# Patient Record
Sex: Male | Born: 1955
Health system: Southern US, Community
[De-identification: ages and names within clinical notes are randomized; demographics above are authoritative.]

## PROBLEM LIST (undated history)

## (undated) DIAGNOSIS — R011 Cardiac murmur, unspecified: Secondary | ICD-10-CM

## (undated) DIAGNOSIS — H269 Unspecified cataract: Secondary | ICD-10-CM

## (undated) DIAGNOSIS — E119 Type 2 diabetes mellitus without complications: Secondary | ICD-10-CM

## (undated) DIAGNOSIS — N200 Calculus of kidney: Secondary | ICD-10-CM

## (undated) DIAGNOSIS — M1990 Unspecified osteoarthritis, unspecified site: Secondary | ICD-10-CM

## (undated) DIAGNOSIS — N400 Enlarged prostate without lower urinary tract symptoms: Secondary | ICD-10-CM

## (undated) DIAGNOSIS — E785 Hyperlipidemia, unspecified: Secondary | ICD-10-CM

## (undated) HISTORY — DX: Calculus of kidney: N20.0

## (undated) HISTORY — DX: Unspecified osteoarthritis, unspecified site: M19.90

## (undated) HISTORY — DX: Type 2 diabetes mellitus without complications: E11.9

## (undated) HISTORY — DX: Benign prostatic hyperplasia without lower urinary tract symptoms: N40.0

## (undated) HISTORY — DX: Hyperlipidemia, unspecified: E78.5

## (undated) HISTORY — DX: Unspecified cataract: H26.9

## (undated) HISTORY — DX: Cardiac murmur, unspecified: R01.1

---

## 1989-07-27 DIAGNOSIS — E785 Hyperlipidemia, unspecified: Secondary | ICD-10-CM

## 1989-07-27 HISTORY — DX: Hyperlipidemia, unspecified: E78.5

## 1996-02-25 ENCOUNTER — Encounter: Payer: Self-pay | Admitting: Family Medicine

## 1996-03-03 ENCOUNTER — Encounter: Payer: Self-pay | Admitting: Family Medicine

## 1996-03-03 LAB — CONVERTED CEMR LAB: PSA: 0.4 ng/mL

## 1997-08-03 ENCOUNTER — Encounter: Payer: Self-pay | Admitting: Family Medicine

## 1997-08-03 LAB — CONVERTED CEMR LAB: Blood Glucose, Fasting: 107 mg/dL

## 2000-02-25 ENCOUNTER — Encounter: Payer: Self-pay | Admitting: Family Medicine

## 2000-02-28 ENCOUNTER — Encounter: Payer: Self-pay | Admitting: Family Medicine

## 2001-01-24 HISTORY — PX: OTHER SURGICAL HISTORY: SHX169

## 2002-03-26 DIAGNOSIS — N400 Enlarged prostate without lower urinary tract symptoms: Secondary | ICD-10-CM

## 2002-03-26 HISTORY — DX: Benign prostatic hyperplasia without lower urinary tract symptoms: N40.0

## 2002-04-17 ENCOUNTER — Encounter: Payer: Self-pay | Admitting: Family Medicine

## 2002-04-17 LAB — CONVERTED CEMR LAB
Blood Glucose, Fasting: 108 mg/dL
TSH: 0.72 microintl units/mL

## 2004-03-28 ENCOUNTER — Encounter: Payer: Self-pay | Admitting: Family Medicine

## 2004-03-28 LAB — CONVERTED CEMR LAB: TSH: 0.98 microintl units/mL

## 2005-06-26 ENCOUNTER — Encounter: Payer: Self-pay | Admitting: Family Medicine

## 2005-06-26 LAB — CONVERTED CEMR LAB: PSA: 0.34 ng/mL

## 2005-06-28 ENCOUNTER — Ambulatory Visit: Payer: Self-pay | Admitting: Family Medicine

## 2005-06-28 LAB — CONVERTED CEMR LAB: Blood Glucose, Fasting: 117 mg/dL

## 2005-07-02 ENCOUNTER — Ambulatory Visit: Payer: Self-pay | Admitting: Family Medicine

## 2005-07-23 ENCOUNTER — Ambulatory Visit: Payer: Self-pay | Admitting: Family Medicine

## 2006-07-04 ENCOUNTER — Ambulatory Visit: Payer: Self-pay | Admitting: Family Medicine

## 2006-07-04 LAB — CONVERTED CEMR LAB
Blood Glucose, Fasting: 115 mg/dL
PSA: 0.37 ng/mL

## 2006-07-09 ENCOUNTER — Ambulatory Visit: Payer: Self-pay | Admitting: Family Medicine

## 2006-08-12 ENCOUNTER — Ambulatory Visit: Payer: Self-pay | Admitting: Family Medicine

## 2007-07-09 ENCOUNTER — Encounter: Payer: Self-pay | Admitting: Family Medicine

## 2007-07-09 DIAGNOSIS — J309 Allergic rhinitis, unspecified: Secondary | ICD-10-CM | POA: Insufficient documentation

## 2007-07-09 DIAGNOSIS — E785 Hyperlipidemia, unspecified: Secondary | ICD-10-CM | POA: Insufficient documentation

## 2007-07-09 DIAGNOSIS — N400 Enlarged prostate without lower urinary tract symptoms: Secondary | ICD-10-CM | POA: Insufficient documentation

## 2007-07-14 ENCOUNTER — Ambulatory Visit: Payer: Self-pay | Admitting: Family Medicine

## 2007-07-14 DIAGNOSIS — N200 Calculus of kidney: Secondary | ICD-10-CM | POA: Insufficient documentation

## 2007-07-14 DIAGNOSIS — E119 Type 2 diabetes mellitus without complications: Secondary | ICD-10-CM | POA: Insufficient documentation

## 2007-07-14 LAB — CONVERTED CEMR LAB
ALT: 24 units/L (ref 0–53)
AST: 27 units/L (ref 0–37)
Albumin: 3.6 g/dL (ref 3.5–5.2)
BUN: 8 mg/dL (ref 6–23)
Bilirubin, Direct: 0.1 mg/dL (ref 0.0–0.3)
Cholesterol: 184 mg/dL (ref 0–200)
Potassium: 3.6 meq/L (ref 3.5–5.1)
Sodium: 143 meq/L (ref 135–145)
TSH: 0.74 microintl units/mL (ref 0.35–5.50)
Total Bilirubin: 0.7 mg/dL (ref 0.3–1.2)
Total CHOL/HDL Ratio: 6.3
VLDL: 43 mg/dL — ABNORMAL HIGH (ref 0–40)

## 2007-07-16 ENCOUNTER — Ambulatory Visit: Payer: Self-pay | Admitting: Family Medicine

## 2007-07-28 HISTORY — PX: COLONOSCOPY: SHX174

## 2007-08-08 ENCOUNTER — Ambulatory Visit: Payer: Self-pay | Admitting: Gastroenterology

## 2007-08-22 ENCOUNTER — Encounter: Payer: Self-pay | Admitting: Family Medicine

## 2007-08-22 ENCOUNTER — Encounter: Payer: Self-pay | Admitting: Gastroenterology

## 2007-08-22 ENCOUNTER — Ambulatory Visit: Payer: Self-pay | Admitting: Gastroenterology

## 2007-08-22 DIAGNOSIS — D126 Benign neoplasm of colon, unspecified: Secondary | ICD-10-CM | POA: Insufficient documentation

## 2007-09-03 ENCOUNTER — Emergency Department (HOSPITAL_COMMUNITY): Admission: EM | Admit: 2007-09-03 | Discharge: 2007-09-03 | Payer: Self-pay | Admitting: Emergency Medicine

## 2007-09-24 ENCOUNTER — Telehealth (INDEPENDENT_AMBULATORY_CARE_PROVIDER_SITE_OTHER): Payer: Self-pay | Admitting: *Deleted

## 2008-02-12 ENCOUNTER — Encounter: Payer: Self-pay | Admitting: Family Medicine

## 2008-03-09 ENCOUNTER — Encounter: Payer: Self-pay | Admitting: Family Medicine

## 2008-09-20 ENCOUNTER — Ambulatory Visit: Payer: Self-pay | Admitting: Family Medicine

## 2008-09-21 LAB — CONVERTED CEMR LAB
ALT: 23 units/L (ref 0–53)
AST: 23 units/L (ref 0–37)
BUN: 12 mg/dL (ref 6–23)
Basophils Relative: 0.5 % (ref 0.0–3.0)
CO2: 28 meq/L (ref 19–32)
Calcium: 8.5 mg/dL (ref 8.4–10.5)
Creatinine,U: 308.3 mg/dL
Eosinophils Relative: 1.6 % (ref 0.0–5.0)
Glucose, Bld: 120 mg/dL — ABNORMAL HIGH (ref 70–99)
HCT: 41 % (ref 39.0–52.0)
Hemoglobin: 13.8 g/dL (ref 13.0–17.0)
LDL Cholesterol: 144 mg/dL — ABNORMAL HIGH (ref 0–99)
Lymphocytes Relative: 35.6 % (ref 12.0–46.0)
MCHC: 33.7 g/dL (ref 30.0–36.0)
MCV: 86 fL (ref 78.0–100.0)
Neutrophils Relative %: 55.5 % (ref 43.0–77.0)
Platelets: 198 10*3/uL (ref 150–400)
RDW: 13.1 % (ref 11.5–14.6)
Total CHOL/HDL Ratio: 6
Total Protein: 7.1 g/dL (ref 6.0–8.3)
Triglycerides: 98 mg/dL (ref 0–149)
VLDL: 20 mg/dL (ref 0–40)
WBC: 7.6 10*3/uL (ref 4.5–10.5)

## 2008-09-23 ENCOUNTER — Ambulatory Visit: Payer: Self-pay | Admitting: Family Medicine

## 2008-09-23 DIAGNOSIS — M25519 Pain in unspecified shoulder: Secondary | ICD-10-CM | POA: Insufficient documentation

## 2009-09-21 ENCOUNTER — Ambulatory Visit: Payer: Self-pay | Admitting: Family Medicine

## 2009-09-21 LAB — CONVERTED CEMR LAB
AST: 23 units/L (ref 0–37)
BUN: 14 mg/dL (ref 6–23)
Bilirubin, Direct: 0 mg/dL (ref 0.0–0.3)
CO2: 29 meq/L (ref 19–32)
Calcium: 8.9 mg/dL (ref 8.4–10.5)
Chloride: 107 meq/L (ref 96–112)
Creatinine, Ser: 1 mg/dL (ref 0.4–1.5)
Creatinine,U: 205 mg/dL
GFR calc non Af Amer: 99.56 mL/min (ref >60)
HDL: 38.5 mg/dL — ABNORMAL LOW (ref >39.00)
Microalb Creat Ratio: 1.5 mg/g (ref 0.0–30.0)
Microalb, Ur: 0.3 mg/dL (ref 0.0–1.9)
Potassium: 4.1 meq/L (ref 3.5–5.1)
Sodium: 143 meq/L (ref 135–145)
TSH: 0.71 microintl units/mL (ref 0.35–5.50)
Total Bilirubin: 0.6 mg/dL (ref 0.3–1.2)
Total Protein: 7.1 g/dL (ref 6.0–8.3)
Triglycerides: 147 mg/dL (ref 0.0–149.0)
VLDL: 29.4 mg/dL (ref 0.0–40.0)

## 2009-09-26 ENCOUNTER — Ambulatory Visit: Payer: Self-pay | Admitting: Family Medicine

## 2010-06-29 ENCOUNTER — Encounter (INDEPENDENT_AMBULATORY_CARE_PROVIDER_SITE_OTHER): Payer: Self-pay | Admitting: *Deleted

## 2010-10-03 ENCOUNTER — Ambulatory Visit: Payer: Self-pay | Admitting: Family Medicine

## 2010-10-03 DIAGNOSIS — E559 Vitamin D deficiency, unspecified: Secondary | ICD-10-CM | POA: Insufficient documentation

## 2010-10-03 LAB — CONVERTED CEMR LAB
AST: 24 units/L (ref 0–37)
Albumin: 3.8 g/dL (ref 3.5–5.2)
Basophils Relative: 0.4 % (ref 0.0–3.0)
CO2: 29 meq/L (ref 19–32)
Calcium: 9.2 mg/dL (ref 8.4–10.5)
Chloride: 107 meq/L (ref 96–112)
Cholesterol: 202 mg/dL — ABNORMAL HIGH (ref 0–200)
Creatinine,U: 273.5 mg/dL
Eosinophils Absolute: 0.1 10*3/uL (ref 0.0–0.7)
Eosinophils Relative: 1.5 % (ref 0.0–5.0)
Glucose, Bld: 109 mg/dL — ABNORMAL HIGH (ref 70–99)
HCT: 41.7 % (ref 39.0–52.0)
Lymphocytes Relative: 32.5 % (ref 12.0–46.0)
Lymphs Abs: 2.7 10*3/uL (ref 0.7–4.0)
MCV: 86.8 fL (ref 78.0–100.0)
Monocytes Relative: 5.5 % (ref 3.0–12.0)
Neutro Abs: 5.1 10*3/uL (ref 1.4–7.7)
Neutrophils Relative %: 60.1 % (ref 43.0–77.0)
PSA: 0.33 ng/mL (ref 0.10–4.00)
Platelets: 207 10*3/uL (ref 150.0–400.0)
RBC: 4.81 M/uL (ref 4.22–5.81)
RDW: 13.8 % (ref 11.5–14.6)
TSH: 0.83 microintl units/mL (ref 0.35–5.50)

## 2010-10-04 LAB — CONVERTED CEMR LAB: Vit D, 25-Hydroxy: 32 ng/mL (ref 30–89)

## 2010-10-05 ENCOUNTER — Ambulatory Visit: Payer: Self-pay | Admitting: Family Medicine

## 2010-12-28 NOTE — Letter (Signed)
Summary: Nadara Eaton letter  [place] at [facility]  [address] [place], [place] [address]   Phone: [phone]  Fax: [phone]       06/29/2010 MRN: [id number]  [facility] [address] [place], [place]  [address]  Dear Mr. MOHANNAD, [facility] [place], and [facility] announce the retirement of Arta Silence, M.D., from full-time practice at the [facility] office effective May 25, 2010 and his plans of returning part-time.  It is important to Dr. Hetty Ely and to our practice that you understand that [facility] has seven physicians in our office for your health care needs.  We will continue to offer the same exceptional care that you have today.    Dr. Hetty Ely has spoken to many of you about his plans for retirement and returning part-time in the fall.   We will continue to work with you through the transition to schedule appointments for you in the office and meet the high standards that [facility] is committed to.   Again, it is with great pleasure that we share the news that Dr. Hetty Ely will return to [facility] in October of 2011 with a reduced schedule.    If you have any questions, or would like to request an appointment with one of our physicians, please call us at [phone] and press the option for Scheduling an appointment.  We take pleasure in providing you with excellent patient care and look forward to seeing you at your next office visit.  Our [facility] Physicians are:  Tillman Abide, M.D. Laurita Quint, M.D. Roxy Manns, M.D. Kerby Nora, M.D. Hannah Beat, M.D. Ruthe Mannan, M.D. We proudly welcomed Raechel Ache, M.D. and Eustaquio Boyden, M.D. to the practice in July/August 2011.  Sincerely,  [facility]

## 2010-12-28 NOTE — Assessment & Plan Note (Signed)
Summary: CPX/CLE   Vital Signs:  Patient profile:   55 year old male Weight:      233.25 pounds BMI:     31.75 Temp:     98.5 degrees F oral Pulse rate:   64 / minute Pulse rhythm:   regular BP sitting:   132 / 82  (left arm) Cuff size:   large  Vitals Entered By: Sydell Axon LPN (October 05, 2010 9:22 AM) CC: 30 Minute checkup, had a colonoscopy 09/08   History of Present Illness: Pt here for Comp Exam, feels well with no complaints. 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 Apply As Much As Twice A Day. 2)  Multivitamins  Tabs (Multiple Vitamin) .... Take One By Mouth Daily  Allergies: No Known Drug Allergies  Past History:  Past Medical History: Last updated: 07/09/2007 Allergic rhinitis:03/2002 Hyperlipidemia: 1990's Benign prostatic hypertrophy: 03/2002  Past Surgical History: Last updated: 09/02/2007 DERMATOLOGY : DR. HALL DIAGNOSIS AKN OF SCALP 01/2001 COLONOSCOPY BENIGN POLYPS 08/22/07     2018  Family History: Last updated: 10/05/2010 Father: dec 92 Stroke  ANEMIA, HTN, DM Mother: dec 84 MI  HTN, DM, ELEVATED CHOLESTERAL  BROTHER A 60  DM,  SKIN CANCER Brother A 49 SISTER A 53 Vitiligo  DM  CV: NEGATIVE HBP: + MOTHER DM: +MOTHER GOUT/ARTHRITIS:  PROSTATE CANCER: NEGATIVE: BROTHER SKIN CANCER BREAST/OVARIAN/UTERINE CANCER: COLON CANCER: NEGATIVE DEPRESSION: NEGATIVE ETOH/DRUG ABUSE: + BROTHER, ETOH OTHER: NEGATIVE STROKE  Social History: Last updated: 07/16/2007 Marital Status: Married LIVES WITH WIFE Children: 2 CHILDREN  out of home Occupation:[employer]   Risk Factors: Alcohol Use: 0 (10/05/2010) Caffeine Use: 1 per week (10/05/2010) Exercise: no (10/05/2010)  Risk Factors: Smoking Status: never (10/05/2010) Passive Smoke Exposure: no (10/05/2010)  Family History: Father: dec 92 Stroke  ANEMIA, HTN, DM Mother: dec 84 MI  HTN, DM, ELEVATED CHOLESTERAL  BROTHER A 60  DM,  SKIN CANCER Brother A 49 SISTER A 53 Vitiligo DM  CV: NEGATIVE HBP: + MOTHER DM: +MOTHER GOUT/ARTHRITIS:  PROSTATE CANCER: NEGATIVE: BROTHER SKIN CANCER BREAST/OVARIAN/UTERINE CANCER: COLON CANCER: NEGATIVE DEPRESSION: NEGATIVE ETOH/DRUG ABUSE: + BROTHER, ETOH OTHER: NEGATIVE STROKE  Social History: Caffeine use/day:  1 per week  Review of Systems General:  Denies chills, fatigue, fever, sweats, weakness, and weight loss. Eyes:  Denies blurring, discharge, eye pain, and itching. ENT:  Denies decreased hearing, ear discharge, earache, and ringing in ears. CV:  Denies chest pain or discomfort, fainting, fatigue, palpitations, shortness of breath with exertion, swelling of feet, and swelling of hands. Resp:  Denies cough, shortness of breath, and wheezing. GI:  Denies abdominal pain, bloody stools, change in bowel habits, constipation, dark tarry stools, diarrhea, indigestion,  loss of appetite, nausea, vomiting, vomiting blood, and yellowish skin color. GU:  Complains of nocturia; denies discharge, dysuria, and urinary frequency; two times a night regiularly. MS:  Complains of cramps; denies joint pain, low back pain, muscle aches, and stiffness; occas. Derm:  Denies  dryness, itching, and rash. Neuro:  Denies numbness, poor balance, tingling, and tremors.  Physical Exam  General:  Well-developed,well-nourished,in no acute distress; alert,appropriate and cooperative throughout examination Head:  Normocephalic and atraumatic without obvious abnormalities. No apparent alopecia or balding. Sinuses nontender. Eyes:  Conjunctiva clear bilaterally.  Ears:  External ear exam shows no significant lesions or deformities.  Otoscopic examination reveals clear canals, tympanic membranes are intact bilaterally without bulging, retraction, inflammation or discharge. Hearing is grossly normal bilaterally. Nose:  External nasal examination shows no deformity or inflammation. Nasal mucosa are pink and moist without lesions or exudates. Mouth:  Oral mucosa and oropharynx without lesions or exudates.  Teeth in good repair. Neck:  No deformities, masses, or tenderness noted. Chest Wall:  No deformities, masses, tenderness or gynecomastia noted. Breasts:  No masses or gynecomastia noted Lungs:  Normal respiratory effort, chest expands symmetrically. Lungs are clear to auscultation, no crackles or wheezes. Heart:  Normal rate and regular rhythm. S1 and S2 normal without gallop, murmur, click, rub or other extra sounds. Occas rare dropped beat. Abdomen:  Bowel sounds positive,abdomen soft and non-tender without masses, organomegaly or hernias noted. Rectal:  No external abnormalities noted. Normal sphincter tone. No rectal masses or tenderness. G neg. Genitalia:  Testes bilaterally descended without nodularity, tenderness or masses. No scrotal masses or lesions. No penis lesions or urethral discharge. Prostate:  Prostate gland firm and smooth, no enlargement, nodularity, tenderness, mass, asymmetry or induration. 20-30gms. Msk:  No deformity or scoliosis noted of thoracic or lumbar spine.   Pulses:  R and L carotid,radial,femoral,dorsalis pedis and posterior tibial pulses are full  and equal bilaterally Extremities:  No clubbing, cyanosis, edema, or deformity noted with normal full range of motion of all joints.   Neurologic:  No cranial nerve deficits noted. Station and gait are normal. Sensory, motor and coordinative functions appear intact. Left arm radiculopathy resolved. Skin:  Intact without suspicious lesions or rashes. Chronic mild nudular rash of scalp, keeps hair close cut. Cervical Nodes:  No lymphadenopathy noted Inguinal Nodes:  No significant adenopathy Psych:  Cognition and judgment appear intact. Alert and cooperative with normal attention span and concentration. No apparent delusions, illusions, hallucinations   Impression & Recommendations:  Problem # 1:  HEALTH MAINTENANCE EXAM (ICD-V70.0)  Reviewed preventive care protocols, scheduled due services, and updated immunizations. Flu vacc today.  Problem # 2:  DISEASE, HAIR NEC CROWN OF HEAD (ICD-704.8) Assessment: Unchanged Stable but using generic which he feels does not give quite as good of control.  Problem # 3:  VITAMIN D DEFICIENCY (ICD-268.9) Assessment: Improved Therapeutic on 1000IU  daily. Cont.  Problem # 4:  COLONIC POLYPS, BENIGN (ICD-211.3) Assessment: Unchanged Due Colonoscopy in 18.  Problem # 5:  RENAL CALCULUS (ICD-592.0) Assessment: Improved None recently.  Problem # 6:  BENIGN PROSTATIC HYPERTROPHY (ICD-600.00) Assessment: Unchanged Exam and PSA stable. Sxs stable.  Problem # 7:  HYPERLIPIDEMIA (ICD-272.4) Assessment: Unchanged Adequate. Watch sweets and carbs and start regular exercise. Labs Reviewed: SGOT: 24 (10/03/2010)   SGPT: 21 (10/03/2010)   HDL:35.50 (10/03/2010), 38.50 (09/21/2009)  LDL:125 (09/21/2009), 144 (09/20/2008)  Chol:202 (10/03/2010), 193 (09/21/2009)  Trig:370.0 (10/03/2010), 147.0 (09/21/2009)  Problem # 8:  ALLERGIC RHINITIS (ICD-477.9) Assessment: Unchanged Stable  and not a problem.  Complete Medication List: 1)  Clobetasol Propionate  0.05 % Crea (Clobetasol propionate) .... Apply as much as twice a day. 2)  Multivitamins Tabs (Multiple vitamin) .... Take one by mouth daily 3)  Vitamin D 1000 Unit Tabs (Cholecalciferol) .... Take one by mouth daily  Other Orders: Admin 1st Vaccine (60454) Flu Vaccine 68yrs + (09811) Prescriptions: CLOBETASOL PROPIONATE 0.05 % CREA (CLOBETASOL PROPIONATE) apply as much as twice a day.  #60gms x PRN   Entered and Authorized by:   Shaune Leeks MD   Signed by:   Shaune Leeks MD on 10/05/2010   Method used:   Elect[facility]ically to        [facility] [id number] (retail[facility]       [address]       [place], [place]  [address]       Ph: [phone] or [phone]       Fax: [phone]   RxID:   [id number]    Orders Added: 1)  Admin 1st Vaccine [90471] 2)  Flu Vaccine 15yrs + [38756] 3)  Est. Patient 40-64 years [43329]    Current Allergies (reviewed today): No known allergies   Flu Vaccine Consent Questions     Do you have a history of severe allergic reactions to this vaccine? no    Any prior history of allergic reactions to egg and/or gelatin? no    Do you have a sensitivity to the preservative Thimersol? no    Do you have a past history of Guillan-Barre Syndrome? no    Do you currently have an acute febrile illness? no    Have you ever had a severe reaction to latex? no    Vaccine information given and explained to patient? yes    Are you currently pregnant? no    Lot Number:[id number]   Exp Date:05/26/2011   Site Given  Left Deltoid [email or web address]

## 2011-09-06 LAB — URINALYSIS, ROUTINE W REFLEX MICROSCOPIC
Leukocytes, UA: NEGATIVE
Nitrite: NEGATIVE
Specific Gravity, Urine: 1.021
Urobilinogen, UA: 0.2
pH: 5.5

## 2011-10-04 ENCOUNTER — Other Ambulatory Visit: Payer: Self-pay | Admitting: Family Medicine

## 2011-10-04 DIAGNOSIS — N400 Enlarged prostate without lower urinary tract symptoms: Secondary | ICD-10-CM

## 2011-10-04 DIAGNOSIS — E785 Hyperlipidemia, unspecified: Secondary | ICD-10-CM

## 2011-10-04 DIAGNOSIS — D126 Benign neoplasm of colon, unspecified: Secondary | ICD-10-CM

## 2011-10-04 DIAGNOSIS — E749 Disorder of carbohydrate metabolism, unspecified: Secondary | ICD-10-CM

## 2011-10-04 DIAGNOSIS — E559 Vitamin D deficiency, unspecified: Secondary | ICD-10-CM

## 2011-10-08 ENCOUNTER — Other Ambulatory Visit (INDEPENDENT_AMBULATORY_CARE_PROVIDER_SITE_OTHER): Payer: 59

## 2011-10-08 DIAGNOSIS — N400 Enlarged prostate without lower urinary tract symptoms: Secondary | ICD-10-CM

## 2011-10-08 DIAGNOSIS — E749 Disorder of carbohydrate metabolism, unspecified: Secondary | ICD-10-CM

## 2011-10-08 DIAGNOSIS — D126 Benign neoplasm of colon, unspecified: Secondary | ICD-10-CM

## 2011-10-08 DIAGNOSIS — E785 Hyperlipidemia, unspecified: Secondary | ICD-10-CM

## 2011-10-08 DIAGNOSIS — E559 Vitamin D deficiency, unspecified: Secondary | ICD-10-CM

## 2011-10-09 LAB — HEPATIC FUNCTION PANEL
ALT: 24 U/L (ref 0–53)
AST: 24 U/L (ref 0–37)
Bilirubin, Direct: 0 mg/dL (ref 0.0–0.3)
Total Bilirubin: 0.7 mg/dL (ref 0.3–1.2)
Total Protein: 7 g/dL (ref 6.0–8.3)

## 2011-10-09 LAB — CBC WITH DIFFERENTIAL/PLATELET
Basophils Relative: 0.4 % (ref 0.0–3.0)
Eosinophils Relative: 1.3 % (ref 0.0–5.0)
HCT: 42.5 % (ref 39.0–52.0)
Hemoglobin: 14 g/dL (ref 13.0–17.0)
Lymphocytes Relative: 35.3 % (ref 12.0–46.0)
MCHC: 32.8 g/dL (ref 30.0–36.0)
Monocytes Absolute: 0.2 10*3/uL (ref 0.1–1.0)
Monocytes Relative: 2.8 % — ABNORMAL LOW (ref 3.0–12.0)
Neutrophils Relative %: 60.2 % (ref 43.0–77.0)
Platelets: 208 10*3/uL (ref 150.0–400.0)
RBC: 4.97 Mil/uL (ref 4.22–5.81)

## 2011-10-09 LAB — RENAL FUNCTION PANEL
Albumin: 3.7 g/dL (ref 3.5–5.2)
CO2: 29 mEq/L (ref 19–32)
Creatinine, Ser: 1 mg/dL (ref 0.4–1.5)
GFR: 97.7 mL/min (ref >60.00)
Phosphorus: 3.5 mg/dL (ref 2.3–4.6)
Potassium: 4.5 mEq/L (ref 3.5–5.1)

## 2011-10-09 LAB — MICROALBUMIN / CREATININE URINE RATIO: Creatinine,U: 259 mg/dL

## 2011-10-09 LAB — TSH: TSH: 1.15 u[IU]/mL (ref 0.35–5.50)

## 2011-10-09 LAB — LIPID PANEL
Triglycerides: 169 mg/dL — ABNORMAL HIGH (ref 0.0–149.0)
VLDL: 33.8 mg/dL (ref 0.0–40.0)

## 2011-10-09 LAB — VITAMIN D 25 HYDROXY (VIT D DEFICIENCY, FRACTURES): Vit D, 25-Hydroxy: 26 ng/mL — ABNORMAL LOW (ref 30–89)

## 2011-10-10 ENCOUNTER — Encounter: Payer: Self-pay | Admitting: Family Medicine

## 2011-10-11 ENCOUNTER — Ambulatory Visit (INDEPENDENT_AMBULATORY_CARE_PROVIDER_SITE_OTHER): Payer: 59 | Admitting: Family Medicine

## 2011-10-11 ENCOUNTER — Encounter: Payer: Self-pay | Admitting: Family Medicine

## 2011-10-11 VITALS — BP 118/70 | HR 84 | Temp 98.7°F | Ht 72.0 in | Wt 226.2 lb

## 2011-10-11 DIAGNOSIS — Z23 Encounter for immunization: Secondary | ICD-10-CM

## 2011-10-11 DIAGNOSIS — E749 Disorder of carbohydrate metabolism, unspecified: Secondary | ICD-10-CM

## 2011-10-11 DIAGNOSIS — E559 Vitamin D deficiency, unspecified: Secondary | ICD-10-CM

## 2011-10-11 DIAGNOSIS — D126 Benign neoplasm of colon, unspecified: Secondary | ICD-10-CM

## 2011-10-11 DIAGNOSIS — E785 Hyperlipidemia, unspecified: Secondary | ICD-10-CM

## 2011-10-11 DIAGNOSIS — N400 Enlarged prostate without lower urinary tract symptoms: Secondary | ICD-10-CM

## 2011-10-11 MED ORDER — CLOBETASOL PROPIONATE 0.05 % EX CREA: TOPICAL_CREAM | Freq: Two times a day (BID) | CUTANEOUS | Status: DC

## 2011-10-11 NOTE — Progress Notes (Signed)
  Subjective:    Patient ID: Philip Callahan, male    DOB: Sep 21, 1956, 55 y.o.   MRN: [id number]  HPI Pt here for yearly Comp Exam. He has not been seen since PE last year. He feels well and has no complaints.     Review of Systems  Constitutional: Negative for fever, chills, diaphoresis, appetite change, fatigue and unexpected weight change.  HENT: Negative for hearing loss, ear pain, tinnitus and ear discharge.   Eyes: Positive for visual disturbance (more difficult at night. ). Negative for pain and discharge.  Respiratory: Negative for cough, shortness of breath and wheezing.   Cardiovascular: Negative for chest pain and palpitations.       No SOB w/ exertion  Gastrointestinal: Negative for nausea, vomiting, abdominal pain, diarrhea, constipation and blood in stool.       No heartburn or swallowing problems.  Genitourinary: Negative for dysuria, frequency and difficulty urinating.       Three time nocturia  Musculoskeletal: Negative for myalgias, back pain and arthralgias.  Skin: Negative for rash.       No itching or dryness.  Neurological: Negative for tremors and numbness.       No tingling or balance problems.  Hematological: Negative for adenopathy. Does not bruise/bleed easily.  Psychiatric/Behavioral: Negative for dysphoric mood and agitation.       Objective:   Physical Exam  Constitutional: He is oriented to person, place, and time. He appears well-developed and well-nourished. No distress.  HENT:  Head: Normocephalic and atraumatic.  Right Ear: External ear normal.  Left Ear: External ear normal.  Nose: Nose normal.  Mouth/Throat: Oropharynx is clear and moist.  Eyes: Conjunctivae and EOM are normal. Pupils are equal, round, and reactive to light. Right eye exhibits no discharge. Left eye exhibits no discharge. No scleral icterus.  Neck: Normal range of motion. Neck supple. No thyromegaly present.  Cardiovascular: Normal rate, regular rhythm, normal heart sounds  and intact distal pulses.   No murmur heard. Pulmonary/Chest: Effort normal and breath sounds normal. No respiratory distress. He has no wheezes.  Abdominal: Soft. Bowel sounds are normal. He exhibits no distension and no mass. There is no tenderness. There is no rebound and no guarding.  Genitourinary: Penis normal.       No rectal done.  Musculoskeletal: Normal range of motion. He exhibits no edema.  Lymphadenopathy:    He has no cervical adenopathy.  Neurological: He is alert and oriented to person, place, and time. Coordination normal.  Skin: Skin is warm and dry. No rash noted. He is not diaphoretic.  Psychiatric: He has a normal mood and affect. His behavior is normal. Judgment and thought content normal.          Assessment & Plan:  HMPE

## 2011-10-11 NOTE — Assessment & Plan Note (Signed)
Adequate but aovid carbs to decrease Trigs and avoid fatty foods to decrease LDL. Lab Results  Component Value Date   CHOL 178 10/08/2011   HDL 41.00 10/08/2011   LDLCALC 103* 10/08/2011   LDLDIRECT 104.1 10/03/2010   TRIG 169.0* 10/08/2011   CHOLHDL 4 10/08/2011

## 2011-10-11 NOTE — Assessment & Plan Note (Signed)
Continues slightly elevated. Watch sweet and carb intake, esp over the holiday season. Exercise helps.

## 2011-10-11 NOTE — Patient Instructions (Signed)
RTC in one year for PE. Call a few months ahead to establish with new doctor.

## 2011-10-11 NOTE — Assessment & Plan Note (Signed)
Rescope in the future.

## 2011-10-11 NOTE — Assessment & Plan Note (Signed)
Stable

## 2011-10-11 NOTE — Assessment & Plan Note (Signed)
Still deficient. Needs to take Vit D 1000Iu daily, every day.

## 2012-09-30 ENCOUNTER — Other Ambulatory Visit: Payer: Self-pay | Admitting: Family Medicine

## 2012-09-30 DIAGNOSIS — Z125 Encounter for screening for malignant neoplasm of prostate: Secondary | ICD-10-CM

## 2012-09-30 DIAGNOSIS — E7800 Pure hypercholesterolemia, unspecified: Secondary | ICD-10-CM

## 2012-10-09 ENCOUNTER — Other Ambulatory Visit (INDEPENDENT_AMBULATORY_CARE_PROVIDER_SITE_OTHER): Payer: 59

## 2012-10-09 DIAGNOSIS — E7800 Pure hypercholesterolemia, unspecified: Secondary | ICD-10-CM

## 2012-10-09 DIAGNOSIS — Z125 Encounter for screening for malignant neoplasm of prostate: Secondary | ICD-10-CM

## 2012-10-09 LAB — LDL CHOLESTEROL, DIRECT: Direct LDL: 141.5 mg/dL

## 2012-10-09 LAB — LIPID PANEL
Cholesterol: 224 mg/dL — ABNORMAL HIGH (ref 0–200)
HDL: 41.7 mg/dL (ref >39.00)
Total CHOL/HDL Ratio: 5
Triglycerides: 202 mg/dL — ABNORMAL HIGH (ref 0.0–149.0)
VLDL: 40.4 mg/dL — ABNORMAL HIGH (ref 0.0–40.0)

## 2012-10-09 LAB — COMPREHENSIVE METABOLIC PANEL
ALT: 25 U/L (ref 0–53)
AST: 25 U/L (ref 0–37)
BUN: 15 mg/dL (ref 6–23)
Creatinine, Ser: 1.1 mg/dL (ref 0.4–1.5)
Total Bilirubin: 0.7 mg/dL (ref 0.3–1.2)

## 2012-10-14 ENCOUNTER — Ambulatory Visit (INDEPENDENT_AMBULATORY_CARE_PROVIDER_SITE_OTHER): Payer: 59 | Admitting: Family Medicine

## 2012-10-14 ENCOUNTER — Encounter: Payer: Self-pay | Admitting: Family Medicine

## 2012-10-14 VITALS — BP 132/80 | HR 80 | Temp 98.2°F | Ht 73.0 in | Wt 234.8 lb

## 2012-10-14 DIAGNOSIS — E559 Vitamin D deficiency, unspecified: Secondary | ICD-10-CM

## 2012-10-14 DIAGNOSIS — E119 Type 2 diabetes mellitus without complications: Secondary | ICD-10-CM

## 2012-10-14 DIAGNOSIS — R739 Hyperglycemia, unspecified: Secondary | ICD-10-CM

## 2012-10-14 DIAGNOSIS — Z23 Encounter for immunization: Secondary | ICD-10-CM

## 2012-10-14 DIAGNOSIS — R7309 Other abnormal glucose: Secondary | ICD-10-CM

## 2012-10-14 DIAGNOSIS — E785 Hyperlipidemia, unspecified: Secondary | ICD-10-CM

## 2012-10-14 DIAGNOSIS — Z0000 Encounter for general adult medical examination without abnormal findings: Secondary | ICD-10-CM

## 2012-10-14 LAB — HEMOGLOBIN A1C: Hgb A1c MFr Bld: 6.6 % — ABNORMAL HIGH (ref 4.6–6.5)

## 2012-10-14 NOTE — Progress Notes (Signed)
CPE- See plan.  Routine anticipatory guidance given to patient.  See health maintenance. Tetanus 2005 Flu shot today.   PSA wnl Colonoscopy 2008, 10 year f/u.   Exercise.  "Very little".  Discussed.  Living will d/w pt.  Wife would be designated if incapacitated.    Hyperglycemia.  Recheck A1c today.  D/w pt about likely DM2 and FH, diet, weight, and exercise.    HLD.  D/w pt about this as it relates to DM2/hyperglycemia.  See plan.   PMH and SH reviewed  Meds, vitals, and allergies reviewed.   ROS: See HPI.  Otherwise negative.    GEN: nad, alert and oriented HEENT: mucous membranes moist NECK: supple w/o LA CV: rrr. PULM: ctab, no inc wob ABD: soft, +bs EXT: no edema SKIN: no acute rash

## 2012-10-14 NOTE — Patient Instructions (Addendum)
Go to the lab on the way out.  We'll contact you with your lab report.  Check out [email or web address].  Look up the food and fitness section.   Cut back on sugary foods- see the handout.  Take care.  Glad to see you.  We'll likely need to recheck your labs in 3-6 months.

## 2012-10-15 ENCOUNTER — Encounter: Payer: Self-pay | Admitting: Family Medicine

## 2012-10-15 DIAGNOSIS — Z0000 Encounter for general adult medical examination without abnormal findings: Secondary | ICD-10-CM | POA: Insufficient documentation

## 2012-10-15 NOTE — Assessment & Plan Note (Signed)
Would continue with OTC replacement.

## 2012-10-15 NOTE — Assessment & Plan Note (Signed)
Will work on diet and exercise, recheck in 6 months.

## 2012-10-15 NOTE — Assessment & Plan Note (Signed)
Likely dx with A1c 6.6  Will have pt work on diet and exercise, handout given and he'll check ADA website.  Recheck in 6 months.  See notes on labs.

## 2012-10-15 NOTE — Assessment & Plan Note (Signed)
Routine anticipatory guidance given to patient.  See health maintenance. Tetanus 2005 Flu shot today.   PSA wnl Colonoscopy 2008, 10 year f/u.   Exercise.  "Very little".  Discussed.  Living will d/w pt.  Wife would be designated if incapacitated.

## 2012-12-19 ENCOUNTER — Encounter: Payer: Self-pay | Admitting: Family Medicine

## 2012-12-19 ENCOUNTER — Ambulatory Visit (INDEPENDENT_AMBULATORY_CARE_PROVIDER_SITE_OTHER): Payer: 59 | Admitting: Family Medicine

## 2012-12-19 VITALS — BP 120/80 | HR 78 | Temp 98.8°F | Wt 220.0 lb

## 2012-12-19 DIAGNOSIS — R0981 Nasal congestion: Secondary | ICD-10-CM

## 2012-12-19 DIAGNOSIS — J3489 Other specified disorders of nose and nasal sinuses: Secondary | ICD-10-CM

## 2012-12-19 MED ORDER — FLUTICASONE PROPIONATE 50 MCG/ACT NA SUSP: 2.0000 | Freq: Every day | NASAL | Status: DC

## 2012-12-19 NOTE — Progress Notes (Signed)
Sick for 1 week.  Started with ST, felt dizzy.  Cough.  Congested.  Stuffy.  Would feel cold/hot at night.  Voice is altered, episodically hoarse.  No myalgias.  Now with some scant sputum.  Taking dayquil and nyquil.  It helps him sleep some.  Not much pain across the sinuses.    He's losing weight intentionally and working on his diet.   Had a flu shot.   Meds, vitals, and allergies reviewed.   ROS: See HPI.  Otherwise, noncontributory.  GEN: nad, alert and oriented HEENT: mucous membranes moist, tm w/o erythema, nasal exam w/o erythema, clear discharge noted,  OP with cobblestoning, sinuses not ttp x4 NECK: supple w/o LA CV: rrr.   PULM: ctab, no inc wob EXT: no edema

## 2012-12-19 NOTE — Patient Instructions (Addendum)
Take care.  Keep working on your [employer] and diet.   Use nasal saline a few times a day for the congestions.  If not better, then use the flonase.   Glad to see you.

## 2012-12-19 NOTE — Assessment & Plan Note (Signed)
From URI.  Likely viral.  Nontoxic.  Would use nasal saline and then flonase if not improved.  He agrees.  ddx d/w pt. F/u prn.

## 2013-02-11 ENCOUNTER — Other Ambulatory Visit (INDEPENDENT_AMBULATORY_CARE_PROVIDER_SITE_OTHER): Payer: 59

## 2013-02-11 DIAGNOSIS — E119 Type 2 diabetes mellitus without complications: Secondary | ICD-10-CM

## 2013-02-11 LAB — LIPID PANEL
Cholesterol: 189 mg/dL (ref 0–200)
LDL Cholesterol: 129 mg/dL — ABNORMAL HIGH (ref 0–99)
Total CHOL/HDL Ratio: 5
VLDL: 23.2 mg/dL (ref 0.0–40.0)

## 2013-02-11 LAB — HEMOGLOBIN A1C: Hgb A1c MFr Bld: 6.2 % (ref 4.6–6.5)

## 2013-02-16 ENCOUNTER — Encounter: Payer: Self-pay | Admitting: Family Medicine

## 2013-02-16 ENCOUNTER — Ambulatory Visit (INDEPENDENT_AMBULATORY_CARE_PROVIDER_SITE_OTHER): Payer: 59 | Admitting: Family Medicine

## 2013-02-16 VITALS — BP 144/84 | HR 53 | Temp 98.0°F | Wt 218.0 lb

## 2013-02-16 DIAGNOSIS — R7309 Other abnormal glucose: Secondary | ICD-10-CM

## 2013-02-16 DIAGNOSIS — R739 Hyperglycemia, unspecified: Secondary | ICD-10-CM

## 2013-02-16 NOTE — Progress Notes (Signed)
Based on labs, now with likely hyperglycemia and not DM2.  Has worked hard on diet and weight.  Cut out carbs.  Exercising, plans to swim more for exercise.  Labs d/w pt.  Weight down and he feels better.    Meds, vitals, and allergies reviewed.   ROS: See HPI.  Otherwise, noncontributory.  nad ncat rrr ctab

## 2013-02-16 NOTE — Assessment & Plan Note (Signed)
Likely not DM2 now, labs d/w pt.  Continue to work on diet and exercise and recheck in 4 months. He agrees.  I thanked him for his efforts. TG much improved and lipids okay.

## 2013-02-16 NOTE — Patient Instructions (Addendum)
Thank you for your effort.  Keep working on your weight with diet and exercise.   Recheck A1c (nonfasting) in 4 months and then come see me a few days after that.

## 2013-06-19 ENCOUNTER — Other Ambulatory Visit: Payer: 59

## 2013-06-26 ENCOUNTER — Ambulatory Visit: Payer: 59 | Admitting: Family Medicine

## 2013-10-11 ENCOUNTER — Other Ambulatory Visit: Payer: Self-pay | Admitting: Family Medicine

## 2013-10-11 DIAGNOSIS — R739 Hyperglycemia, unspecified: Secondary | ICD-10-CM

## 2013-10-14 ENCOUNTER — Other Ambulatory Visit (INDEPENDENT_AMBULATORY_CARE_PROVIDER_SITE_OTHER): Payer: 59

## 2013-10-14 DIAGNOSIS — R739 Hyperglycemia, unspecified: Secondary | ICD-10-CM

## 2013-10-14 DIAGNOSIS — Z0000 Encounter for general adult medical examination without abnormal findings: Secondary | ICD-10-CM

## 2013-10-14 DIAGNOSIS — E785 Hyperlipidemia, unspecified: Secondary | ICD-10-CM

## 2013-10-14 DIAGNOSIS — R7309 Other abnormal glucose: Secondary | ICD-10-CM

## 2013-10-14 LAB — HEMOGLOBIN A1C: Hgb A1c MFr Bld: 6.2 % (ref 4.6–6.5)

## 2013-10-14 LAB — COMPREHENSIVE METABOLIC PANEL
ALT: 19 U/L (ref 0–53)
AST: 23 U/L (ref 0–37)
Alkaline Phosphatase: 63 U/L (ref 39–117)
Glucose, Bld: 120 mg/dL — ABNORMAL HIGH (ref 70–99)
Sodium: 139 mEq/L (ref 135–145)
Total Bilirubin: 0.5 mg/dL (ref 0.3–1.2)
Total Protein: 7.2 g/dL (ref 6.0–8.3)

## 2013-10-14 LAB — LIPID PANEL
Total CHOL/HDL Ratio: 5
Triglycerides: 248 mg/dL — ABNORMAL HIGH (ref 0.0–149.0)

## 2013-10-14 LAB — LDL CHOLESTEROL, DIRECT: Direct LDL: 107.8 mg/dL

## 2013-10-21 ENCOUNTER — Ambulatory Visit (INDEPENDENT_AMBULATORY_CARE_PROVIDER_SITE_OTHER): Payer: 59 | Admitting: Family Medicine

## 2013-10-21 ENCOUNTER — Encounter: Payer: Self-pay | Admitting: Family Medicine

## 2013-10-21 VITALS — BP 140/80 | HR 72 | Temp 98.2°F | Ht 73.0 in | Wt 228.0 lb

## 2013-10-21 DIAGNOSIS — J309 Allergic rhinitis, unspecified: Secondary | ICD-10-CM

## 2013-10-21 DIAGNOSIS — Z0000 Encounter for general adult medical examination without abnormal findings: Secondary | ICD-10-CM

## 2013-10-21 DIAGNOSIS — R739 Hyperglycemia, unspecified: Secondary | ICD-10-CM

## 2013-10-21 DIAGNOSIS — R7309 Other abnormal glucose: Secondary | ICD-10-CM

## 2013-10-21 MED ORDER — FLUTICASONE PROPIONATE 50 MCG/ACT NA SUSP: 2.0000 | Freq: Every day | NASAL | Status: DC | PRN

## 2013-10-21 NOTE — Progress Notes (Signed)
CPE- See plan.  Routine anticipatory guidance given to patient.  See health maintenance. Tetanus 2005 Flu shot done at work  Colonoscopy 2008.  Prostate cancer screening and PSA options (with potential risks and benefits of testing vs not testing) were discussed along with recent recs/guidelines.  He declined testing PSA at this point. See below re: diet and exercise.   Living will d/w pt. Would have wife designated if incapacitated.    Hyperglycemia.  D/w pt.  Less exercise recently.  "I fell off the wagon."  He's going to get back on diet and exercise routine.  Discussed.    PMH and SH reviewed  Meds, vitals, and allergies reviewed.   ROS: See HPI.  Otherwise negative.    GEN: nad, alert and oriented HEENT: mucous membranes moist NECK: supple w/o LA CV: rrr. PULM: ctab, no inc wob ABD: soft, +bs EXT: no edema SKIN: no acute rash

## 2013-10-21 NOTE — Patient Instructions (Addendum)
Check with your insurance to see if they will cover the shingles shot at 58.  Get back on your diet and exercise routine.  Take care.   Recheck labs in about 1 year before a physical.   Glad to see you.

## 2013-10-23 NOTE — Assessment & Plan Note (Signed)
Likely not DM2 at this point, but the risk is there.  Labs d/w pt.  He'll work on diet and exercise.  Recheck periodically.  He agrees.

## 2013-10-23 NOTE — Assessment & Plan Note (Signed)
Routine anticipatory guidance given to patient.  See health maintenance. Tetanus 2005 Flu shot done at work  Colonoscopy 2008.  Prostate cancer screening and PSA options (with potential risks and benefits of testing vs not testing) were discussed along with recent recs/guidelines.  He declined testing PSA at this point. See below re: diet and exercise.   Living will d/w pt. Would have wife designated if incapacitated.

## 2013-10-23 NOTE — Assessment & Plan Note (Signed)
Controlled with current meds. Continue as is. 

## 2014-08-23 ENCOUNTER — Ambulatory Visit (INDEPENDENT_AMBULATORY_CARE_PROVIDER_SITE_OTHER)
Admission: RE | Admit: 2014-08-23 | Discharge: 2014-08-23 | Disposition: A | Discharge status: Home / Self Care | Payer: 59 | Source: Ambulatory Visit | Attending: Family Medicine | Admitting: Family Medicine

## 2014-08-23 ENCOUNTER — Ambulatory Visit (INDEPENDENT_AMBULATORY_CARE_PROVIDER_SITE_OTHER): Payer: 59 | Admitting: Family Medicine

## 2014-08-23 ENCOUNTER — Telehealth: Payer: Self-pay

## 2014-08-23 ENCOUNTER — Encounter: Payer: Self-pay | Admitting: Family Medicine

## 2014-08-23 VITALS — BP 150/80 | HR 86 | Temp 98.3°F | Ht 73.0 in | Wt 233.5 lb

## 2014-08-23 DIAGNOSIS — R0789 Other chest pain: Secondary | ICD-10-CM

## 2014-08-23 DIAGNOSIS — R071 Chest pain on breathing: Secondary | ICD-10-CM

## 2014-08-23 DIAGNOSIS — J209 Acute bronchitis, unspecified: Secondary | ICD-10-CM

## 2014-08-23 NOTE — Assessment & Plan Note (Signed)
With viral bronchitis -likely 2ndary to this and cough  cxr today  Recommend ibuprofen Q 6 hours with food prn for chest wall pain

## 2014-08-23 NOTE — Telephone Encounter (Signed)
Pt was seen By Dr. Glori Bickers

## 2014-08-23 NOTE — Patient Instructions (Signed)
Chest xray today  Try ibuprofen 400 mg with food up to every 6 hours for fever and pain  mucinex DM over the counter for cough and congestion Lots of rest and fluids We will update you with chest xray findings    Update if not starting to improve in a week or if worsening

## 2014-08-23 NOTE — Progress Notes (Signed)
Pre visit review using our clinic review tool, if applicable. No additional management support is needed unless otherwise documented below in the visit note. 

## 2014-08-23 NOTE — Telephone Encounter (Signed)
Pt walked in complaining of heavy pressure in mid chest now; 08/21/14 started with pressure and tightness in mid chest; today congested in chest, with non productive cough, fever, feels sore in chest today and soreness worsens when takes deep breath. Pt in room and appt scheduled with Dr Glori Bickers at 9 AM.

## 2014-08-23 NOTE — Progress Notes (Signed)
Subjective:    Patient ID: Philip Callahan, male    DOB: 04/02/1956, 58 y.o.   MRN: [id number]  HPI Here with fever and uri symptoms  Sat Philip Callahan had discomfort in chest when lying in bed - a soreness and tenderness Coughing -- with a lot of congestion rattling but Philip Callahan cannot get it out  Feels generally weak  Suspect fever -Philip Callahan felt warm  Has had the chills and the aches as well   Throat is not sore  Some nasal congestion - not that bad   (more in chest)  Over the counter  Alka seltzer cold plus day and night  They have acetaminophen in them   Patient Active Problem List   Diagnosis Date Noted  . Nasal congestion 12/19/2012  . Routine general medical examination at a health care facility 10/15/2012  . VITAMIN D DEFICIENCY 10/03/2010  . COLONIC POLYPS, BENIGN 08/22/2007  . Hyperglycemia 07/14/2007  . RENAL CALCULUS 07/14/2007  . HYPERLIPIDEMIA 07/09/2007  . ALLERGIC RHINITIS 07/09/2007  . BENIGN PROSTATIC HYPERTROPHY 07/09/2007  . POSITIVE PPD 07/09/2007   Past Medical History  Diagnosis Date  . Allergic rhinitis 03/2002  . Hyperlipemia 1990's  . BPH (benign prostatic hyperplasia) 03/2002  . Renal stones    Past Surgical History  Procedure Laterality Date  . Akn  of scalp  01/2001    Dermatology: Dr. Nevada Crane   History  Substance Use Topics  . Smoking status: Never Smoker   . Smokeless tobacco: Never Used  . Alcohol Use: No   Family History  Problem Relation Age of Onset  . Heart disease Mother     MI  . Hypertension Mother   . Diabetes Mother   . Hyperlipidemia Mother   . Stroke Father   . Hypertension Father   . Diabetes Father   . Anemia Father   . Diabetes Sister   . Vitiligo Sister   . Diabetes Brother   . Cancer Brother     Skin  . Alcohol abuse Brother   . Heart disease Brother     Pacemaker  . Depression Neg Hx   . Colon cancer Neg Hx   . Prostate cancer Neg Hx    No Known Allergies Current Outpatient Prescriptions on File Prior to Visit    Medication Sig Dispense Refill  . Cholecalciferol (VITAMIN D) 1000 UNITS capsule Take 1,000 Units by mouth 3 (three) times a week.       . fluticasone (FLONASE) 50 MCG/ACT nasal spray Place 2 sprays into both nostrils daily as needed.  16 g  12  . Multiple Vitamin (MULTIVITAMIN) tablet Take 1 tablet by mouth 3 (three) times a week.        No current facility-administered medications on file prior to visit.       EKG today NSR with rate of 85  Flattened T waves in lat leads     Review of Systems    Review of Systems  Constitutional: Negative for , appetite change,  and unexpected weight change. pos for malaise/fatigue ENT pos for post nasal drip , neg for sinus pain  Eyes: Negative for pain and visual disturbance.  Respiratory: Negative for wheeze  and shortness of breath.   Cardiovascular: Negative for palpitations   Neg for pedal edema  Gastrointestinal: Negative for nausea, diarrhea and constipation.  Genitourinary: Negative for urgency and frequency.  Skin: Negative for pallor or rash   Neurological: Negative for weakness, light-headedness, numbness and headaches.  Hematological: Negative for  adenopathy. Does not bruise/bleed easily.  Psychiatric/Behavioral: Negative for dysphoric mood. The patient is not nervous/anxious.      Objective:   Physical Exam  Constitutional: Philip Callahan appears well-developed and well-nourished. No distress.  overwt and well appearing   HENT:  Head: Normocephalic and atraumatic.  Right Ear: External ear normal.  Left Ear: External ear normal.  Mouth/Throat: Oropharynx is clear and moist. No oropharyngeal exudate.  Nares are boggy Clear rhinorrhea No sinus tenderness  Eyes: Conjunctivae and EOM are normal. Pupils are equal, round, and reactive to light. Right eye exhibits no discharge. Left eye exhibits no discharge. No scleral icterus.  Neck: Normal range of motion. Neck supple.  Nl rom neck   Cardiovascular: Normal rate, regular rhythm, normal  heart sounds and intact distal pulses.   Pulmonary/Chest: Breath sounds normal. No respiratory distress. Philip Callahan has no wheezes. Philip Callahan has no rales. Philip Callahan exhibits tenderness.  Harsh bs with occ scattered rhonchi  No rales or wheezing   Tender anterior chest wall w/o skin change or crepitus   Musculoskeletal: Philip Callahan exhibits no edema.  Lymphadenopathy:    Philip Callahan has no cervical adenopathy.  Neurological: Philip Callahan is alert.  Skin: Skin is warm and dry. No rash noted.  Psychiatric: Philip Callahan has a normal mood and affect.          Assessment & Plan:   Problem List Items Addressed This Visit     Respiratory   Acute bronchitis - Primary     Suspect viral - with cough and chest wall discomfort /pleuritic  cxr today  Recommend ibuprofen for chest wall pain  mucinex DM for cough/ congestion   Update if not starting to improve in a week or if worsening  (esp if fever/inc prod cough/wheeze)     Relevant Orders      DG Chest 2 View (Completed)     Other   Chest wall pain     With viral bronchitis -likely 2ndary to this and cough  cxr today  Recommend ibuprofen Q 6 hours with food prn for chest wall pain     Relevant Orders      DG Chest 2 View (Completed)    Other Visit Diagnoses   Chest pressure        Relevant Orders       EKG 12-Lead (Completed)

## 2014-08-23 NOTE — Assessment & Plan Note (Signed)
Suspect viral - with cough and chest wall discomfort /pleuritic  cxr today  Recommend ibuprofen for chest wall pain  mucinex DM for cough/ congestion   Update if not starting to improve in a week or if worsening  (esp if fever/inc prod cough/wheeze)

## 2014-08-25 ENCOUNTER — Telehealth: Payer: Self-pay | Admitting: Family Medicine

## 2014-08-25 DIAGNOSIS — R9431 Abnormal electrocardiogram [ECG] [EKG]: Secondary | ICD-10-CM

## 2014-08-25 NOTE — Telephone Encounter (Signed)
Spoke with pt and he does agree to see cardiology, please put referral in, I advise pt Marion/Linda will call to get appt scheduled

## 2014-08-25 NOTE — Telephone Encounter (Signed)
Pt aware -will route to Dr Damita Dunnings

## 2014-08-25 NOTE — Telephone Encounter (Signed)
Please let pt know that I had Dr Damita Dunnings review his EKG as we planned - he wants to refer him for a cardiology visit to evaluate further. Please ask if he is agreeable and I will have him do the referral.   I hope he is feeling better!

## 2014-08-25 NOTE — Telephone Encounter (Signed)
Ordered. Thanks

## 2014-09-13 ENCOUNTER — Ambulatory Visit: Payer: 59 | Admitting: Cardiovascular Disease

## 2014-09-21 ENCOUNTER — Ambulatory Visit: Payer: 59 | Admitting: Cardiovascular Disease

## 2014-09-28 ENCOUNTER — Encounter (INDEPENDENT_AMBULATORY_CARE_PROVIDER_SITE_OTHER): Payer: Self-pay

## 2014-09-28 ENCOUNTER — Ambulatory Visit (INDEPENDENT_AMBULATORY_CARE_PROVIDER_SITE_OTHER): Payer: 59 | Admitting: Cardiovascular Disease

## 2014-09-28 ENCOUNTER — Encounter: Payer: Self-pay | Admitting: Cardiovascular Disease

## 2014-09-28 VITALS — BP 140/98 | HR 75 | Ht 72.0 in | Wt 237.5 lb

## 2014-09-28 DIAGNOSIS — R0789 Other chest pain: Secondary | ICD-10-CM

## 2014-09-28 NOTE — Assessment & Plan Note (Signed)
Noncardiac chest pain was likely related to recent bronchitis and coughing. Symptoms completely resolved. The EKG from September is not available for review. However, his EKG today is normal. Cardiac exam is unremarkable. Given the lack of symptoms at the present time and normal EKG today, I do not recommend any further workup. I discussed with him the importance of healthy lifestyle changes in order to decrease the chance of future atherosclerosis. He can follow-up with [place] as needed.

## 2014-09-28 NOTE — Patient Instructions (Signed)
Follow up as needed

## 2014-09-28 NOTE — Progress Notes (Signed)
Primary care physician: Dr. Damita Dunnings  HPI  This is a pleasant 58 year old man who was referred for evaluation of an abnormal EKG. He has no previous cardiac history and no significant chronic medical conditions other than hyperlipidemia. He is not a smoker and has no family history of premature coronary artery disease. Recently, he was sick with bronchitis which cause significant coughing, soreness in his chest and congestion. He had significant dyspnea. He had an EKG done which was abnormal. However, I'm not able to retrieve the EKG for review. The patient was treated conservatively with resolution of symptoms after 2 weeks. He feels completely back to his normal self with no residual symptoms. He denies any exertional chest pain or dyspnea. No orthopnea, PND or lower extremity edema.  No Known Allergies   Current Outpatient Prescriptions on File Prior to Visit  Medication Sig Dispense Refill  . Cholecalciferol (VITAMIN D) 1000 UNITS capsule Take 1,000 Units by mouth as needed.     . fluticasone (FLONASE) 50 MCG/ACT nasal spray Place 2 sprays into both nostrils daily as needed. 16 g 12  . Multiple Vitamin (MULTIVITAMIN) tablet Take 1 tablet by mouth as needed.      No current facility-administered medications on file prior to visit.     Past Medical History  Diagnosis Date  . Allergic rhinitis 03/2002  . Hyperlipemia 1990's  . BPH (benign prostatic hyperplasia) 03/2002  . Renal stones   . Heart murmur     as child     Past Surgical History  Procedure Laterality Date  . Akn  of scalp  01/2001    Dermatology: Dr. Nevada Crane     Family History  Problem Relation Age of Onset  . Heart disease Mother     MI  . Hypertension Mother   . Diabetes Mother   . Hyperlipidemia Mother   . Stroke Father   . Hypertension Father   . Diabetes Father   . Anemia Father   . Diabetes Sister   . Vitiligo Sister   . Diabetes Brother   . Cancer Brother     Skin  . Alcohol abuse Brother   . Heart  disease Brother     Pacemaker  . Depression Neg Hx   . Colon cancer Neg Hx   . Prostate cancer Neg Hx      History   Social History  . Marital Status: Married    Spouse Name: N/A    Number of Children: 2  . Years of Education: N/A   Occupational History  . [place] History Main Topics  . Smoking status: Never Smoker   . Smokeless tobacco: Never Used  . Alcohol Use: No  . Drug Use: No  . Sexual Activity: Yes   Other Topics Concern  . Not on file   Social History Narrative   Lives with wife, married 1976   1 daughter local, 1 son out of state Administrator, Civil Service, does mission work)   Works with [employer], Furniture conservator/restorer.    [employer] fan     ROS A 10 point review of system was performed. It is negative other than that mentioned in the history of present illness.   PHYSICAL EXAM   BP 140/98 mmHg  Pulse 75  Ht 6' (1.829 m)  Wt 237 lb 8 oz (107.729 kg)  BMI 32.20 kg/m2 Constitutional: He is oriented to person, place, and time. He appears well-developed and well-nourished. No distress.  HENT: No nasal  discharge.  Head: Normocephalic and atraumatic.  Eyes: Pupils are equal and round.  No discharge. Neck: Normal range of motion. Neck supple. No JVD present. No thyromegaly present.  Cardiovascular: Normal rate, regular rhythm, normal heart sounds. Exam reveals no gallop and no friction rub. No murmur heard.  Pulmonary/Chest: Effort normal and breath sounds normal. No stridor. No respiratory distress. He has no wheezes. He has no rales. He exhibits no tenderness.  Abdominal: Soft. Bowel sounds are normal. He exhibits no distension. There is no tenderness. There is no rebound and no guarding.  Musculoskeletal: Normal range of motion. He exhibits no edema and no tenderness.  Neurological: He is alert and oriented to person, place, and time. Coordination normal.  Skin: Skin is warm and dry. No rash noted. He is not diaphoretic. No  erythema. No pallor.  Psychiatric: He has a normal mood and affect. His behavior is normal. Judgment and thought content normal.       PVX:YIAXKP sinus rhythm. No significant Q waves. No significant ST or T wave changes.   ASSESSMENT AND PLAN

## 2014-10-26 ENCOUNTER — Other Ambulatory Visit: Payer: Self-pay | Admitting: Family Medicine

## 2014-10-26 DIAGNOSIS — R739 Hyperglycemia, unspecified: Secondary | ICD-10-CM

## 2014-10-29 ENCOUNTER — Other Ambulatory Visit (INDEPENDENT_AMBULATORY_CARE_PROVIDER_SITE_OTHER): Payer: 59

## 2014-10-29 DIAGNOSIS — R739 Hyperglycemia, unspecified: Secondary | ICD-10-CM

## 2014-10-31 LAB — COMPREHENSIVE METABOLIC PANEL
ALT: 26 U/L (ref 0–53)
AST: 26 U/L (ref 0–37)
Albumin: 4 g/dL (ref 3.5–5.2)
Alkaline Phosphatase: 72 U/L (ref 39–117)
BILIRUBIN TOTAL: 0.4 mg/dL (ref 0.2–1.2)
BUN: 12 mg/dL (ref 6–23)
CO2: 28 meq/L (ref 19–32)
CREATININE: 1.1 mg/dL (ref 0.4–1.5)
Calcium: 9.1 mg/dL (ref 8.4–10.5)
Chloride: 108 mEq/L (ref 96–112)
GFR: 92.44 mL/min (ref >60.00)
GLUCOSE: 122 mg/dL — AB (ref 70–99)
Potassium: 4.3 mEq/L (ref 3.5–5.1)
Sodium: 141 mEq/L (ref 135–145)
Total Protein: 7.1 g/dL (ref 6.0–8.3)

## 2014-10-31 LAB — LIPID PANEL
CHOL/HDL RATIO: 6
CHOLESTEROL: 216 mg/dL — AB (ref 0–200)
HDL: 38.8 mg/dL — AB (ref >39.00)
NonHDL: 177.2
TRIGLYCERIDES: 341 mg/dL — AB (ref 0.0–149.0)
VLDL: 68.2 mg/dL — AB (ref 0.0–40.0)

## 2014-10-31 LAB — LDL CHOLESTEROL, DIRECT: Direct LDL: 108.1 mg/dL

## 2014-10-31 LAB — HEMOGLOBIN A1C: Hgb A1c MFr Bld: 7 % — ABNORMAL HIGH (ref 4.6–6.5)

## 2014-11-03 ENCOUNTER — Ambulatory Visit (INDEPENDENT_AMBULATORY_CARE_PROVIDER_SITE_OTHER): Payer: 59 | Admitting: Family Medicine

## 2014-11-03 ENCOUNTER — Encounter: Payer: Self-pay | Admitting: Family Medicine

## 2014-11-03 VITALS — BP 144/82 | HR 68 | Temp 98.4°F | Ht 71.75 in | Wt 234.0 lb

## 2014-11-03 DIAGNOSIS — R739 Hyperglycemia, unspecified: Secondary | ICD-10-CM

## 2014-11-03 DIAGNOSIS — Z0000 Encounter for general adult medical examination without abnormal findings: Secondary | ICD-10-CM

## 2014-11-03 DIAGNOSIS — Z7189 Other specified counseling: Secondary | ICD-10-CM

## 2014-11-03 DIAGNOSIS — Z23 Encounter for immunization: Secondary | ICD-10-CM

## 2014-11-03 NOTE — Progress Notes (Signed)
Pre visit review using our clinic review tool, if applicable. No additional management support is needed unless otherwise documented below in the visit note.  CPE- See plan. Routine anticipatory guidance given to patient. See health maintenance. Tetanus 2015 Flu shot today.  PNA at 63.  Shingles shot d/w pt.  Colonoscopy 2008.  Prostate cancer screening and PSA options (with potential risks and benefits of testing vs not testing) were discussed along with recent recs/guidelines. He declined testing PSA at this point. See below re: diet and exercise.  Living will d/w pt. Would have wife designated if incapacitated.   Hyperglycemia. D/w pt. Less exercise recently. "I did good for about 3-4 months, then I fell off the wagon again." He's going to get back on diet and exercise routine. Discussed. he is at the point of DM2 by A1c, not by fasting sugar.    PMH and SH reviewed  Meds, vitals, and allergies reviewed.   ROS: See HPI. Otherwise negative.   GEN: nad, alert and oriented HEENT: mucous membranes moist NECK: supple w/o LA CV: rrr. PULM: ctab, no inc wob ABD: soft, +bs EXT: no edema SKIN: no acute rash

## 2014-11-03 NOTE — Patient Instructions (Addendum)
Check with your insurance to see if they will cover the shingles shot. Pick 2 and get started: Cut out soda Cut back on bread and potatoes Less sugary cereal Exercise bike.  Recheck labs in about 3 months.   Take care.  Glad to see you.

## 2014-11-04 DIAGNOSIS — Z7189 Other specified counseling: Secondary | ICD-10-CM | POA: Insufficient documentation

## 2014-11-04 NOTE — Assessment & Plan Note (Signed)
Less exercise recently. "I did good for about 3-4 months, then I fell off the wagon again." He's going to get back on diet and exercise routine. Discussed. he is at the point of DM2 by A1c, not by fasting sugar.   With TG elevation noted, d/w pt.  He's going to to work on diet and exercise, see AVS.  Recheck in about 3 months at a lab visit.  We'll make f/u plans at that point.   Recheck BP 144/82, okay for now.

## 2014-11-04 NOTE — Assessment & Plan Note (Signed)
Routine anticipatory guidance given to patient. See health maintenance. Tetanus 2015 Flu shot today.  PNA at 63.  Shingles shot d/w pt.  Colonoscopy 2008.  Prostate cancer screening and PSA options (with potential risks and benefits of testing vs not testing) were discussed along with recent recs/guidelines. He declined testing PSA at this point. See below re: diet and exercise.  Living will d/w pt. Would have wife designated if incapacitated.

## 2015-02-02 ENCOUNTER — Other Ambulatory Visit: Payer: Self-pay | Admitting: Family Medicine

## 2015-02-02 ENCOUNTER — Other Ambulatory Visit (INDEPENDENT_AMBULATORY_CARE_PROVIDER_SITE_OTHER): Payer: 59

## 2015-02-02 DIAGNOSIS — R739 Hyperglycemia, unspecified: Secondary | ICD-10-CM

## 2015-02-02 DIAGNOSIS — E119 Type 2 diabetes mellitus without complications: Secondary | ICD-10-CM

## 2015-02-02 LAB — HEMOGLOBIN A1C: Hgb A1c MFr Bld: 7 % — ABNORMAL HIGH (ref 4.6–6.5)

## 2015-02-03 ENCOUNTER — Telehealth: Payer: Self-pay

## 2015-02-03 NOTE — Telephone Encounter (Signed)
Patient aware of lab results, recommendations and appointment made.

## 2015-02-03 NOTE — Telephone Encounter (Signed)
-----   Message from Tonia Ghent, MD sent at 02/02/2015 11:48 PM EST ----- Call pt.  Still with DM2.  Doesn't need meds.  Needs work on weight, low carb diet.  Recheck labs in another 3 months, with OV a few days after labs.  Thanks.

## 2015-05-06 ENCOUNTER — Other Ambulatory Visit (INDEPENDENT_AMBULATORY_CARE_PROVIDER_SITE_OTHER): Payer: 59

## 2015-05-06 DIAGNOSIS — E119 Type 2 diabetes mellitus without complications: Secondary | ICD-10-CM | POA: Diagnosis not present

## 2015-05-06 LAB — MICROALBUMIN / CREATININE URINE RATIO
Creatinine,U: 143.1 mg/dL
MICROALB/CREAT RATIO: 0.5 mg/g (ref 0.0–30.0)

## 2015-05-06 LAB — BASIC METABOLIC PANEL
BUN: 12 mg/dL (ref 6–23)
CHLORIDE: 106 meq/L (ref 96–112)
CO2: 27 meq/L (ref 19–32)
Calcium: 9.2 mg/dL (ref 8.4–10.5)
Creatinine, Ser: 0.99 mg/dL (ref 0.40–1.50)
GFR: 98.77 mL/min (ref >60.00)
Glucose, Bld: 117 mg/dL — ABNORMAL HIGH (ref 70–99)
Potassium: 4.1 mEq/L (ref 3.5–5.1)
Sodium: 138 mEq/L (ref 135–145)

## 2015-05-06 LAB — HEMOGLOBIN A1C: HEMOGLOBIN A1C: 6.5 % (ref 4.6–6.5)

## 2015-05-09 ENCOUNTER — Ambulatory Visit: Payer: 59 | Admitting: Family Medicine

## 2015-05-10 ENCOUNTER — Ambulatory Visit (INDEPENDENT_AMBULATORY_CARE_PROVIDER_SITE_OTHER): Payer: 59 | Admitting: Family Medicine

## 2015-05-10 ENCOUNTER — Encounter: Payer: Self-pay | Admitting: Family Medicine

## 2015-05-10 VITALS — BP 146/80 | HR 76 | Temp 98.4°F | Wt 234.5 lb

## 2015-05-10 DIAGNOSIS — Z125 Encounter for screening for malignant neoplasm of prostate: Secondary | ICD-10-CM | POA: Diagnosis not present

## 2015-05-10 DIAGNOSIS — Z8042 Family history of malignant neoplasm of prostate: Secondary | ICD-10-CM | POA: Diagnosis not present

## 2015-05-10 DIAGNOSIS — R739 Hyperglycemia, unspecified: Secondary | ICD-10-CM

## 2015-05-10 NOTE — Progress Notes (Signed)
Pre visit review using our clinic review tool, if applicable. No additional management support is needed unless otherwise documented below in the visit note.  A1c f/u.  Getting more exercise.  Diet improved.  Drinking more water.  A1c improved, dw pt.   FH prostate cancer.  Recently found out by patient.  Brother and father had PCa.  Reasonable to check PSA today.    Meds, vitals, and allergies reviewed.   ROS: See HPI.  Otherwise, noncontributory.  GEN: nad, alert and oriented HEENT: mucous membranes moist NECK: supple w/o LA CV: rrr.  no murmur PULM: ctab, no inc wob ABD: soft, +bs EXT: no edema SKIN: no acute rash

## 2015-05-10 NOTE — Patient Instructions (Addendum)
Go to the lab on the way out.  We'll contact you with your lab report. Recheck in about 6 months, labs before a physical.  Take care.  Glad to see you.  Thanks for your effort.

## 2015-05-11 DIAGNOSIS — Z8042 Family history of malignant neoplasm of prostate: Secondary | ICD-10-CM | POA: Insufficient documentation

## 2015-05-11 LAB — PSA: PSA: 0.32 ng/mL (ref 0.10–4.00)

## 2015-05-11 NOTE — Assessment & Plan Note (Signed)
PSA wnl, see notes on labs.

## 2015-05-11 NOTE — Assessment & Plan Note (Signed)
Likely not Dm2 by this point.  Getting more exercise.  Diet improved.  Drinking more water.  A1c improved, dw pt.  Recheck in about 6 months.  He agrees.  Continue diet and exercise.

## 2016-02-13 ENCOUNTER — Other Ambulatory Visit (INDEPENDENT_AMBULATORY_CARE_PROVIDER_SITE_OTHER): Payer: 59

## 2016-02-13 DIAGNOSIS — R739 Hyperglycemia, unspecified: Secondary | ICD-10-CM

## 2016-02-13 DIAGNOSIS — R7989 Other specified abnormal findings of blood chemistry: Secondary | ICD-10-CM | POA: Diagnosis not present

## 2016-02-13 LAB — LIPID PANEL
CHOLESTEROL: 221 mg/dL — AB (ref 0–200)
HDL: 35.6 mg/dL — AB (ref >39.00)
NonHDL: 185.6
TRIGLYCERIDES: 220 mg/dL — AB (ref 0.0–149.0)
Total CHOL/HDL Ratio: 6
VLDL: 44 mg/dL — ABNORMAL HIGH (ref 0.0–40.0)

## 2016-02-13 LAB — COMPREHENSIVE METABOLIC PANEL
ALBUMIN: 4.1 g/dL (ref 3.5–5.2)
ALK PHOS: 80 U/L (ref 39–117)
ALT: 23 U/L (ref 0–53)
AST: 21 U/L (ref 0–37)
BILIRUBIN TOTAL: 0.5 mg/dL (ref 0.2–1.2)
BUN: 13 mg/dL (ref 6–23)
CALCIUM: 9.6 mg/dL (ref 8.4–10.5)
CO2: 29 mEq/L (ref 19–32)
CREATININE: 1.08 mg/dL (ref 0.40–1.50)
Chloride: 105 mEq/L (ref 96–112)
GFR: 89.1 mL/min (ref >60.00)
Glucose, Bld: 145 mg/dL — ABNORMAL HIGH (ref 70–99)
Potassium: 3.9 mEq/L (ref 3.5–5.1)
Sodium: 140 mEq/L (ref 135–145)
TOTAL PROTEIN: 7.4 g/dL (ref 6.0–8.3)

## 2016-02-13 LAB — LDL CHOLESTEROL, DIRECT: Direct LDL: 135 mg/dL

## 2016-02-13 LAB — HEMOGLOBIN A1C: Hgb A1c MFr Bld: 7.3 % — ABNORMAL HIGH (ref 4.6–6.5)

## 2016-02-16 ENCOUNTER — Encounter: Payer: 59 | Admitting: Family Medicine

## 2016-02-29 ENCOUNTER — Encounter: Payer: 59 | Admitting: Family Medicine

## 2016-03-27 ENCOUNTER — Encounter: Payer: Self-pay | Admitting: Family Medicine

## 2016-03-27 ENCOUNTER — Ambulatory Visit (INDEPENDENT_AMBULATORY_CARE_PROVIDER_SITE_OTHER): Payer: 59 | Admitting: Family Medicine

## 2016-03-27 VITALS — BP 142/84 | HR 73 | Temp 98.0°F | Ht 72.0 in | Wt 244.5 lb

## 2016-03-27 DIAGNOSIS — Z125 Encounter for screening for malignant neoplasm of prostate: Secondary | ICD-10-CM

## 2016-03-27 DIAGNOSIS — Z0000 Encounter for general adult medical examination without abnormal findings: Secondary | ICD-10-CM | POA: Diagnosis not present

## 2016-03-27 DIAGNOSIS — E119 Type 2 diabetes mellitus without complications: Secondary | ICD-10-CM

## 2016-03-27 DIAGNOSIS — Z119 Encounter for screening for infectious and parasitic diseases, unspecified: Secondary | ICD-10-CM

## 2016-03-27 NOTE — Patient Instructions (Addendum)
Check with your insurance to see if they will cover the shingles shot. Get back to exercising and cut back on carbs/sweets/whites.   I'll check your meds for the trip.  Recheck lab in about 3 months, after the trip.  Take care.  Glad to see you.

## 2016-03-27 NOTE — Progress Notes (Signed)
Pre visit review using our clinic review tool, if applicable. No additional management support is needed unless otherwise documented below in the visit note.  CPE- See plan.  Routine anticipatory guidance given to patient.  See health maintenance. Tetanus 2015 Flu shot today.  PNA at 63.  Shingles shot d/w pt.  Colonoscopy 2008.  PSA wnl <12 months ago.   Diet and exercise d/w pt.   Living will d/w pt. Would have wife designated if incapacitated.  Pt opts in for HIV screening.  D/w pt re: routine screening.   Pt opts in for HCV screening.  D/w pt re: routine screening.    He is travelling to [place] in July, will be there for about 1 month, I told him I would check on recs and notify him.    DM2.  dx'd by A1c, not on meds.  D/w pt.  Sugar and lipids both up, d/w pt.  Weight is up.  D/w pt.  See plan.   Due for eye exam.    PMH and SH reviewed  Meds, vitals, and allergies reviewed.   ROS: Per HPI.  Unless specifically indicated otherwise in HPI, the patient denies:  General: fever. Eyes: acute vision changes ENT: sore throat Cardiovascular: chest pain Respiratory: SOB GI: vomiting GU: dysuria Musculoskeletal: acute back pain Derm: acute rash Neuro: acute motor dysfunction Psych: worsening mood Endocrine: polydipsia Heme: bleeding Allergy: hayfever  GEN: nad, alert and oriented HEENT: mucous membranes moist NECK: supple w/o LA CV: rrr. PULM: ctab, no inc wob ABD: soft, +bs EXT: no edema SKIN: no acute rash  Diabetic foot exam: Normal inspection No skin breakdown No calluses  Normal DP pulses Normal sensation to light touch and monofilament Nails normal

## 2016-03-28 NOTE — Assessment & Plan Note (Signed)
Tetanus 2015 Flu shot today.  PNA at 63.  Shingles shot d/w pt.  Colonoscopy 2008.  PSA wnl <12 months ago.   Diet and exercise d/w pt.   Living will d/w pt. Would have wife designated if incapacitated.  Pt opts in for HIV screening.  D/w pt re: routine screening.   Pt opts in for HCV screening.  D/w pt re: routine screening.

## 2016-03-28 NOTE — Assessment & Plan Note (Signed)
Dx'd by A1c, not on meds. D/w pt. Sugar and lipids both up, d/w pt. Weight is up. D/w pt. He'll work on weight and recheck with other labs in about 3 months.  See orders.  He agrees.

## 2016-03-29 ENCOUNTER — Telehealth: Payer: Self-pay | Admitting: Family Medicine

## 2016-03-29 MED ORDER — ATOVAQUONE-PROGUANIL HCL 250-100 MG PO TABS: ORAL_TABLET | ORAL | Status: DC

## 2016-03-29 NOTE — Addendum Note (Signed)
Addended by: Tonia Ghent on: 03/29/2016 01:36 PM   Modules accepted: Orders

## 2016-03-29 NOTE — Telephone Encounter (Signed)
Wife called back and stated that Philip Callahan has had all of his immunizations, he only needs the malaria prophylaxis Rx.

## 2016-03-29 NOTE — Telephone Encounter (Signed)
Patient advised.

## 2016-03-29 NOTE — Telephone Encounter (Signed)
Call pt.  Travelling to [place].  He'll likely need HAV/HBV vaccine (at least to get the series started, if not prev done), along with typhoid and yellow fever vaccine.  Per CDC, he'll have to have proof of yellow fever vaccine to enter [place].  He'll also need to consider malaria prophylaxis.   We don't have the yellow fever vaccine here.   I would have him talk to the travel clinic at the health department.  They should be able to help him out.  Hope he has a great trip.  Thanks.

## 2016-03-29 NOTE — Telephone Encounter (Signed)
Noted.  Sent rx for Atovaquone and proguanil.  See dosing.   Start before trip, take through trip and then for 7 more days.  Thanks.

## 2017-08-13 ENCOUNTER — Other Ambulatory Visit (INDEPENDENT_AMBULATORY_CARE_PROVIDER_SITE_OTHER): Payer: 59

## 2017-08-13 DIAGNOSIS — Z125 Encounter for screening for malignant neoplasm of prostate: Secondary | ICD-10-CM | POA: Diagnosis not present

## 2017-08-13 DIAGNOSIS — Z119 Encounter for screening for infectious and parasitic diseases, unspecified: Secondary | ICD-10-CM

## 2017-08-13 DIAGNOSIS — E119 Type 2 diabetes mellitus without complications: Secondary | ICD-10-CM | POA: Diagnosis not present

## 2017-08-13 LAB — LIPID PANEL
CHOLESTEROL: 222 mg/dL — AB (ref 0–200)
HDL: 47.4 mg/dL (ref >39.00)
LDL CALC: 140 mg/dL — AB (ref 0–99)
NonHDL: 174.16
Total CHOL/HDL Ratio: 5
Triglycerides: 173 mg/dL — ABNORMAL HIGH (ref 0.0–149.0)
VLDL: 34.6 mg/dL (ref 0.0–40.0)

## 2017-08-13 LAB — HEMOGLOBIN A1C: Hgb A1c MFr Bld: 7.2 % — ABNORMAL HIGH (ref 4.6–6.5)

## 2017-08-13 LAB — PSA: PSA: 0.46 ng/mL (ref 0.10–4.00)

## 2017-08-14 LAB — HIV ANTIBODY (ROUTINE TESTING W REFLEX): HIV 1&2 Ab, 4th Generation: NONREACTIVE

## 2017-08-14 LAB — HEPATITIS C ANTIBODY
Hepatitis C Ab: NONREACTIVE
SIGNAL TO CUT-OFF: 0.08 (ref <1.00)

## 2017-08-15 ENCOUNTER — Ambulatory Visit (INDEPENDENT_AMBULATORY_CARE_PROVIDER_SITE_OTHER): Payer: 59 | Admitting: Family Medicine

## 2017-08-15 ENCOUNTER — Encounter: Payer: Self-pay | Admitting: Family Medicine

## 2017-08-15 VITALS — BP 122/80 | HR 75 | Temp 98.0°F | Ht 72.0 in | Wt 226.2 lb

## 2017-08-15 DIAGNOSIS — E119 Type 2 diabetes mellitus without complications: Secondary | ICD-10-CM

## 2017-08-15 DIAGNOSIS — Z0000 Encounter for general adult medical examination without abnormal findings: Secondary | ICD-10-CM

## 2017-08-15 DIAGNOSIS — Z7189 Other specified counseling: Secondary | ICD-10-CM

## 2017-08-15 DIAGNOSIS — Z23 Encounter for immunization: Secondary | ICD-10-CM | POA: Diagnosis not present

## 2017-08-15 NOTE — Patient Instructions (Addendum)
Check with your insurance to see if they will cover the shingrix shot. If you don't get a call about a colonoscopy by the end of the year then let me know.   Call about an eye exam.  Tell them to check you for diabetes in your eyes.  Recheck in about 6 months, labs ahead of time.  We'll contact you with your urine report (diabetic kidney test).  Keep working on diet.  Get on the bike.  Take care.  Glad to see you.

## 2017-08-15 NOTE — Progress Notes (Signed)
CPE- See plan.  Routine anticipatory guidance given to patient.  See health maintenance.  The possibility exists that previously documented standard health maintenance information may have been brought forward from a previous encounter into this note.  If needed, that same information has been updated to reflect the current situation based on today's encounter.    Tetanus 2015 Flu shot today.  PNA at 63.  Shingles shot d/w pt.  Colonoscopy 2008.  see AVS.  PSA wnl, d/w pt.   Diet and exercise d/w pt.  Diet is better than exercise.   Living will d/w pt. Would have wife designated if patient were incapacitated.  HIV and HCV neg.     Diabetes:  No meds Hypoglycemic episodes:no sx usually, only if prolonged fasting.   Hyperglycemic episodes:no sx Feet problems:no Blood Sugars averaging: not checked.   eye exam within last year: due, d/w pt. Labs d/w pt.     PMH and SH reviewed  Meds, vitals, and allergies reviewed.   ROS: Per HPI.  Unless specifically indicated otherwise in HPI, the patient denies:  General: fever. Eyes: acute vision changes ENT: sore throat Cardiovascular: chest pain Respiratory: SOB GI: vomiting GU: dysuria Musculoskeletal: acute back pain Derm: acute rash Neuro: acute motor dysfunction Psych: worsening mood Endocrine: polydipsia Heme: bleeding Allergy: hayfever  GEN: nad, alert and oriented HEENT: mucous membranes moist NECK: supple w/o LA CV: rrr. PULM: ctab, no inc wob ABD: soft, +bs EXT: no edema SKIN: no acute rash  Diabetic foot exam: Normal inspection No skin breakdown No calluses  Normal DP pulses Normal sensation to light touch and monofilament Nails normal

## 2017-08-16 LAB — MICROALBUMIN / CREATININE URINE RATIO
Creatinine,U: 120 mg/dL
Microalb Creat Ratio: 0.6 mg/g (ref 0.0–30.0)

## 2017-08-18 NOTE — Assessment & Plan Note (Addendum)
A1c is similar to previous. Would not need extra medication at this point. Needs more work on diet and exercise. Discussed with patient. Microalbumin is negative at this point. Foot exam is okay. ASCVD score calculated, approximately 16%. He could cut this roughly in half if he were not diabetic. Discussed with patient. Getting his diabetes addressed via diet and exercise is likely the most meaningful intervention at this point. Recheck labs along with lipids later on. He agrees.  See AVS.

## 2017-08-18 NOTE — Assessment & Plan Note (Signed)
Living will d/w pt. Would have wife designated if patient were incapacitated.  

## 2017-08-18 NOTE — Assessment & Plan Note (Signed)
Tetanus 2015 Flu shot today.  PNA at 63.  Shingles shot d/w pt.  Colonoscopy 2008.  see AVS.  PSA wnl, d/w pt.   Diet and exercise d/w pt.  Diet is better than exercise.   Living will d/w pt. Would have wife designated if patient were incapacitated.  HIV and HCV neg.

## 2017-09-23 ENCOUNTER — Encounter: Payer: Self-pay | Admitting: Gastroenterology

## 2017-10-08 ENCOUNTER — Encounter: Payer: Self-pay | Admitting: Gastroenterology

## 2017-10-28 ENCOUNTER — Other Ambulatory Visit: Payer: Self-pay

## 2017-10-28 ENCOUNTER — Ambulatory Visit (AMBULATORY_SURGERY_CENTER): Payer: Self-pay | Admitting: *Deleted

## 2017-10-28 VITALS — Ht 72.0 in | Wt 230.8 lb

## 2017-10-28 DIAGNOSIS — Z1211 Encounter for screening for malignant neoplasm of colon: Secondary | ICD-10-CM

## 2017-10-28 MED ORDER — SUPREP BOWEL PREP KIT 17.5-3.13-1.6 GM/177ML PO SOLN: 1.0000 | Freq: Once | ORAL | 0 refills | Status: AC

## 2017-10-28 NOTE — Progress Notes (Signed)
Patient denies any allergies to egg or soy products. Patient denies complications with anesthesia/sedation.  Patient denies oxygen use at home and denies diet medications. Pamphlet on colonoscopy given to patient. 

## 2017-10-31 ENCOUNTER — Encounter: Payer: Self-pay | Admitting: Gastroenterology

## 2017-11-06 ENCOUNTER — Ambulatory Visit (AMBULATORY_SURGERY_CENTER): Payer: 59 | Admitting: Gastroenterology

## 2017-11-06 ENCOUNTER — Encounter: Payer: Self-pay | Admitting: Gastroenterology

## 2017-11-06 ENCOUNTER — Other Ambulatory Visit: Payer: Self-pay

## 2017-11-06 VITALS — BP 137/65 | HR 70 | Temp 96.8°F | Resp 16 | Ht 72.0 in | Wt 230.0 lb

## 2017-11-06 DIAGNOSIS — Z1211 Encounter for screening for malignant neoplasm of colon: Secondary | ICD-10-CM

## 2017-11-06 DIAGNOSIS — D124 Benign neoplasm of descending colon: Secondary | ICD-10-CM

## 2017-11-06 DIAGNOSIS — D122 Benign neoplasm of ascending colon: Secondary | ICD-10-CM

## 2017-11-06 MED ORDER — SODIUM CHLORIDE 0.9 % IV SOLN: 500.0000 mL | Freq: Once | INTRAVENOUS | Status: DC

## 2017-11-06 NOTE — Progress Notes (Signed)
Report to PACU, RN, vss, BBS= Clear.  

## 2017-11-06 NOTE — Patient Instructions (Signed)
Colon polyps removed today. Handouts given on polyps,hemorrhoids.  Resume current medications. Result letter in your mail in 2-3 weeks. Call us with any questions or concerns. Thank you!  YOU HAD AN ENDOSCOPIC PROCEDURE TODAY AT [facility]:   Refer to the procedure report that was given to you for any specific questions about what was found during the examination.  If the procedure report does not answer your questions, please call your gastroenterologist to clarify.  If you requested that your care partner not be given the details of your procedure findings, then the procedure report has been included in a sealed envelope for you to review at your convenience later.  YOU SHOULD EXPECT: Some feelings of bloating in the abdomen. Passage of more gas than usual.  Walking can help get rid of the air that was put into your GI tract during the procedure and reduce the bloating. If you had a lower endoscopy (such as a colonoscopy or flexible sigmoidoscopy) you may notice spotting of blood in your stool or on the toilet paper. If you underwent a bowel prep for your procedure, you may not have a normal bowel movement for a few days.  Please Note:  You might notice some irritation and congestion in your nose or some drainage.  This is from the oxygen used during your procedure.  There is no need for concern and it should clear up in a day or so.  SYMPTOMS TO REPORT IMMEDIATELY:   Following lower endoscopy (colonoscopy or flexible sigmoidoscopy):  Excessive amounts of blood in the stool  Significant tenderness or worsening of abdominal pains  Swelling of the abdomen that is new, acute  Fever of 100F or higher   For urgent or emergent issues, a gastroenterologist can be reached at any hour by calling [phone].   DIET:  We do recommend a small meal at first, but then you may proceed to your regular diet.  Drink plenty of fluids but you should avoid alcoholic beverages for 24  hours.  ACTIVITY:  You should plan to take it easy for the rest of today and you should NOT DRIVE or use heavy machinery until tomorrow (because of the sedation medicines used during the test).    FOLLOW UP: Our staff will call the number listed on your records the next business day following your procedure to check on you and address any questions or concerns that you may have regarding the information given to you following your procedure. If we do not reach you, we will leave a message.  However, if you are feeling well and you are not experiencing any problems, there is no need to return our call.  We will assume that you have returned to your regular daily activities without incident.  If any biopsies were taken you will be contacted by phone or by letter within the next 1-3 weeks.  Please call us at [phone] if you have not heard about the biopsies in 3 weeks.    SIGNATURES/CONFIDENTIALITY: You and/or your care partner have signed paperwork which will be entered into your electronic medical record.  These signatures attest to the fact that that the information above on your After Visit Summary has been reviewed and is understood.  Full responsibility of the confidentiality of this discharge information lies with you and/or your care-partner.

## 2017-11-06 NOTE — Progress Notes (Signed)
Pt's states no medical or surgical changes since previsit or office visit. 

## 2017-11-06 NOTE — Op Note (Signed)
[place] Patient Name: Philip Callahan Procedure Date: 11/06/2017 8:03 AM MRN: [id number] Endoscopist: Ladene Artist , MD Age: 61 Referring MD:  Date of Birth: November 24, 1956 Gender: Male Account #: [id number] Procedure:                Colonoscopy Indications:              Screening for colorectal malignant neoplasm Medicines:                Monitored Anesthesia Care Procedure:                Pre-Anesthesia Assessment:                           - Prior to the procedure, a History and Physical                            was performed, and patient medications and                            allergies were reviewed. The patient's tolerance of                            previous anesthesia was also reviewed. The risks                            and benefits of the procedure and the sedation                            options and risks were discussed with the patient.                            All questions were answered, and informed consent                            was obtained. Prior Anticoagulants: The patient has                            taken no previous anticoagulant or antiplatelet                            agents. ASA Grade Assessment: II - A patient with                            mild systemic disease. After reviewing the risks                            and benefits, the patient was deemed in                            satisfactory condition to undergo the procedure.                           After obtaining informed consent, the colonoscope  was passed under direct vision. Throughout the                            procedure, the patient's blood pressure, pulse, and                            oxygen saturations were monitored continuously. The                            Colonoscope was introduced through the anus and                            advanced to the the cecum, identified by                            appendiceal orifice and  ileocecal valve. The                            ileocecal valve, appendiceal orifice, and rectum                            were photographed. The quality of the bowel                            preparation was excellent. The colonoscopy was                            performed without difficulty. The patient tolerated                            the procedure well. Scope In: 8:05:39 AM Scope Out: 8:18:45 AM Scope Withdrawal Time: 0 hours 11 minutes 19 seconds  Total Procedure Duration: 0 hours 13 minutes 6 seconds  Findings:                 The perianal and digital rectal examinations were                            normal.                           Two sessile polyps were found in the descending                            colon and ascending colon. The polyps were 7 to 8                            mm in size. These polyps were removed with a cold                            snare. Resection and retrieval were complete.                           A 5 mm polyp was found in the descending colon. The  polyp was sessile. The polyp was removed with a                            cold biopsy forceps. Resection and retrieval were                            complete.                           Internal hemorrhoids were found during                            retroflexion. The hemorrhoids were small and Grade                            I (internal hemorrhoids that do not prolapse).                           The exam was otherwise without abnormality on                            direct and retroflexion views. Complications:            No immediate complications. Estimated blood loss:                            None. Estimated Blood Loss:     Estimated blood loss: none. Impression:               - Two 7 to 8 mm polyps in the descending colon and                            in the ascending colon, removed with a cold snare.                            Resected and retrieved.                            - One 5 mm polyp in the descending colon, removed                            with a cold biopsy forceps. Resected and retrieved.                           - Internal hemorrhoids.                           - The examination was otherwise normal on direct                            and retroflexion views. Recommendation:           - Repeat colonoscopy in 5 years for surveillance if                            polyp(s) are precancerous, otherwise  10 years.                           - Patient has a contact number available for                            emergencies. The signs and symptoms of potential                            delayed complications were discussed with the                            patient. Return to normal activities tomorrow.                            Written discharge instructions were provided to the                            patient.                           - Resume previous diet.                           - Continue present medications.                           - Await pathology results. Ladene Artist, MD 11/06/2017 8:21:50 AM This report has been signed electronically.

## 2017-11-06 NOTE — Progress Notes (Signed)
Please call if any questions or concerns.Please call if any questions or concerns.Called to room to assist during endoscopic procedure.  Patient ID and intended procedure confirmed with present staff. Received instructions for my participation in the procedure from the performing physician. 

## 2017-11-07 ENCOUNTER — Telehealth: Payer: Self-pay | Admitting: *Deleted

## 2017-11-07 NOTE — Telephone Encounter (Signed)
  Follow up Call-  Call back number 11/06/2017  Post procedure Call Back phone  # [phone]  Permission to leave phone message Yes  Some recent data might be hidden   [facility]

## 2017-11-07 NOTE — Telephone Encounter (Signed)
  Follow up Call-  Call back number 11/06/2017  Post procedure Call Back phone  # [phone]  Permission to leave phone message Yes  Some recent data might be hidden     Patient questions:  Do you have a fever, pain , or abdominal swelling? No. Pain Score  0 *  Have you tolerated food without any problems? Yes.    Have you been able to return to your normal activities? Yes.    Do you have any questions about your discharge instructions: Diet   No. Medications  No. Follow up visit  No.  Do you have questions or concerns about your Care? No.  Actions: * If pain score is 4 or above: No action needed, pain <4.

## 2017-11-22 ENCOUNTER — Encounter: Payer: Self-pay | Admitting: Gastroenterology

## 2018-02-11 ENCOUNTER — Other Ambulatory Visit: Payer: 59

## 2018-02-13 ENCOUNTER — Ambulatory Visit: Payer: 59 | Admitting: Family Medicine

## 2018-12-16 ENCOUNTER — Encounter: Payer: Self-pay | Admitting: Family Medicine

## 2018-12-16 ENCOUNTER — Ambulatory Visit (INDEPENDENT_AMBULATORY_CARE_PROVIDER_SITE_OTHER): Payer: Self-pay | Admitting: Family Medicine

## 2018-12-16 DIAGNOSIS — R6889 Other general symptoms and signs: Secondary | ICD-10-CM

## 2018-12-16 MED ORDER — HYDROCORTISONE-ACETIC ACID 1-2 % OT SOLN: 3.0000 [drp] | Freq: Every day | OTIC | 0 refills | Status: DC | PRN

## 2018-12-16 NOTE — Patient Instructions (Signed)
Use the ear drops as needed but not often- see instructions.   Avoid q tips and just irrigate your ears with warm water in the shower.  Take care.  Glad to see you.

## 2018-12-16 NOTE — Progress Notes (Signed)
R ear was itching.  Was using a qtip and the head came off in the ear.  No L ear sx or problems.  No ear pain.  No drainage.   He has had recurrent ear canal irritation and itching.    Meds, vitals, and allergies reviewed.   ROS: Per HPI unless specifically indicated in ROS section   nad ncat He has narrow ear canals bilaterally. R ear with cotton plug removed with irrigation.   Recheck canal and tympanic membrane within normal limits. Left canal and tympanic membrane within normal limits.  No complication on irrigation.

## 2018-12-17 DIAGNOSIS — R6889 Other general symptoms and signs: Secondary | ICD-10-CM | POA: Insufficient documentation

## 2018-12-17 NOTE — Assessment & Plan Note (Signed)
He was using the Q-tip in the first place because of ear itching.  I do not see any obvious lesions.  This is a chronic issue for the patient.  The Q-tip it was successfully removed without complication.  We talked about options.  Advised him not to use Q-tips.  He can irrigate his ears with warm water in the shower as needed.  Otherwise he can use VoSol otic episodically as needed for itching.  See orders.  He will update me as needed.  Tympanic membrane is still intact.

## 2019-06-25 ENCOUNTER — Other Ambulatory Visit: Payer: Self-pay

## 2019-08-01 ENCOUNTER — Emergency Department (HOSPITAL_COMMUNITY)
Admission: EM | Admit: 2019-08-01 | Discharge: 2019-08-01 | Disposition: A | Discharge status: Home / Self Care | Payer: PPO | Attending: Emergency Medicine | Admitting: Emergency Medicine

## 2019-08-01 ENCOUNTER — Other Ambulatory Visit: Payer: Self-pay

## 2019-08-01 ENCOUNTER — Emergency Department (HOSPITAL_COMMUNITY): Payer: PPO

## 2019-08-01 DIAGNOSIS — N132 Hydronephrosis with renal and ureteral calculous obstruction: Secondary | ICD-10-CM | POA: Diagnosis not present

## 2019-08-01 DIAGNOSIS — E119 Type 2 diabetes mellitus without complications: Secondary | ICD-10-CM | POA: Diagnosis not present

## 2019-08-01 DIAGNOSIS — R109 Unspecified abdominal pain: Secondary | ICD-10-CM | POA: Diagnosis present

## 2019-08-01 DIAGNOSIS — Z79899 Other long term (current) drug therapy: Secondary | ICD-10-CM | POA: Insufficient documentation

## 2019-08-01 DIAGNOSIS — N200 Calculus of kidney: Secondary | ICD-10-CM | POA: Insufficient documentation

## 2019-08-01 DIAGNOSIS — K760 Fatty (change of) liver, not elsewhere classified: Secondary | ICD-10-CM | POA: Diagnosis not present

## 2019-08-01 LAB — URINALYSIS, ROUTINE W REFLEX MICROSCOPIC
Bacteria, UA: NONE SEEN
Bilirubin Urine: NEGATIVE
Glucose, UA: >=500 mg/dL — AB
Hgb urine dipstick: NEGATIVE
Ketones, ur: 5 mg/dL — AB
Leukocytes,Ua: NEGATIVE
Nitrite: NEGATIVE
Protein, ur: NEGATIVE mg/dL
Specific Gravity, Urine: 1.028 (ref 1.005–1.030)
pH: 5 (ref 5.0–8.0)

## 2019-08-01 LAB — CBC
HCT: 43.4 % (ref 39.0–52.0)
Hemoglobin: 14.1 g/dL (ref 13.0–17.0)
MCH: 28.2 pg (ref 26.0–34.0)
MCHC: 32.5 g/dL (ref 30.0–36.0)
MCV: 86.8 fL (ref 80.0–100.0)
Platelets: 223 10*3/uL (ref 150–400)
RBC: 5 MIL/uL (ref 4.22–5.81)
RDW: 13.2 % (ref 11.5–15.5)
WBC: 11.7 10*3/uL — ABNORMAL HIGH (ref 4.0–10.5)
nRBC: 0 % (ref 0.0–0.2)

## 2019-08-01 LAB — BASIC METABOLIC PANEL
Anion gap: 12 (ref 5–15)
BUN: 11 mg/dL (ref 8–23)
CO2: 22 mmol/L (ref 22–32)
Calcium: 8.8 mg/dL — ABNORMAL LOW (ref 8.9–10.3)
Chloride: 103 mmol/L (ref 98–111)
Creatinine, Ser: 1.23 mg/dL (ref 0.61–1.24)
GFR calc Af Amer: >60 mL/min (ref >60)
GFR calc non Af Amer: >60 mL/min (ref >60)
Glucose, Bld: 288 mg/dL — ABNORMAL HIGH (ref 70–99)
Potassium: 3.7 mmol/L (ref 3.5–5.1)
Sodium: 137 mmol/L (ref 135–145)

## 2019-08-01 MED ORDER — TAMSULOSIN HCL 0.4 MG PO CAPS: 0.4000 mg | ORAL_CAPSULE | Freq: Every day | ORAL | 0 refills | Status: AC

## 2019-08-01 MED ORDER — ONDANSETRON HCL 4 MG/2ML IJ SOLN
4.0000 mg | Freq: Once | INTRAMUSCULAR | Status: AC
  Administered 2019-08-01: 4 mg via INTRAVENOUS
  Filled 2019-08-01: qty 2

## 2019-08-01 MED ORDER — MORPHINE SULFATE (PF) 4 MG/ML IV SOLN
4.0000 mg | Freq: Once | INTRAVENOUS | Status: AC
  Administered 2019-08-01: 4 mg via INTRAVENOUS
  Filled 2019-08-01: qty 1

## 2019-08-01 MED ORDER — KETOROLAC TROMETHAMINE 15 MG/ML IJ SOLN
15.0000 mg | Freq: Once | INTRAMUSCULAR | Status: AC
  Administered 2019-08-01: 15 mg via INTRAVENOUS
  Filled 2019-08-01: qty 1

## 2019-08-01 MED ORDER — SODIUM CHLORIDE 0.9 % IV BOLUS
1000.0000 mL | Freq: Once | INTRAVENOUS | Status: AC
  Administered 2019-08-01: 1000 mL via INTRAVENOUS

## 2019-08-01 NOTE — ED Notes (Signed)
Pt givnen dc instructions pt verbalizes understanding

## 2019-08-01 NOTE — ED Triage Notes (Signed)
Pt to ER for evaluation of right flank pain onset at 3 am woke him from his sleep with the urge to urinate but inability to do so. States history of kidney stones with similar symptoms. Unable to find comfort with position changes. He is a/o x4, skin is warm and dry. Reports nausea, no vomiting.

## 2019-08-01 NOTE — Discharge Instructions (Signed)
As discussed, you have been diagnosed with kidney stones. Typically with the medication provided here the pain is manageable. However, if you develop worsening pain, please use Tylenol, 1000 mg, up to 3 times daily for additional pain relief, as well as ibuprofen, 4 mg, up to 3 times daily.  Return here for concerning changes in your condition otherwise, please follow-up with our urology colleagues.

## 2019-08-01 NOTE — ED Provider Notes (Signed)
[facility] EMERGENCY DEPARTMENT Provider Note   CSN: [id number] Arrival date & time: 08/01/19  [id number]     History   Chief Complaint Chief Complaint  Patient presents with  . Nephrolithiasis    HPI Philip Callahan is a 63 y.o. male.     HPI Patient presents after the acute onset of right-sided abdominal pain. This began 5 hours ago, woke him from sleep, with concurrent urge to urinate. However, the patient was unable to urinate until just recently. The pain is sharp, severe, with no position of comfort. He was also nauseous, unable to take medication for pain relief, though he did not vomit. No fever. Patient has a history of kidney stones in the distant past, no prior interventions required. He states that he is generally well, takes no medication regularly, and was in his usual state of health prior to the onset of pain this morning.  By the time of my evaluation the pain has improved somewhat, but is still 8/10. Past Medical History:  Diagnosis Date  . Allergic rhinitis 03/2002  . BPH (benign prostatic hyperplasia) 03/2002  . Diabetes mellitus without complication ([place])    prediabetic per patient - no meds, diet controlled  . Heart murmur    as child, no problems  . Hyperlipemia 1990's   diet controlled  . Kidney stones    passed stones    Patient Active Problem List   Diagnosis Date Noted  . Ear symptom 12/17/2018  . FH: prostate cancer 05/11/2015  . Advance care planning 11/04/2014  . Chest wall pain 08/23/2014  . Routine general medical examination at a health care facility 10/15/2012  . VITAMIN D DEFICIENCY 10/03/2010  . COLONIC POLYPS, BENIGN 08/22/2007  . Diabetes mellitus without complication ([place]) 0000000  . RENAL CALCULUS 07/14/2007  . HYPERLIPIDEMIA 07/09/2007  . ALLERGIC RHINITIS 07/09/2007  . BENIGN PROSTATIC HYPERTROPHY 07/09/2007  . POSITIVE PPD 07/09/2007    Past Surgical History:  Procedure Laterality Date  . AKN  of  scalp  01/2001   Dermatology: Dr. Nevada Crane  . COLONOSCOPY  07/2007        Home Medications    Prior to Admission medications   Medication Sig Start Date End Date Taking? Authorizing Provider  acetic acid-hydrocortisone (VOSOL-HC) OTIC solution Place 3 drops into both ears daily as needed (limit use to twice per week.). 12/16/18   Tonia Ghent, MD  Cholecalciferol 2000 units TABS Take by mouth daily as needed.    [provider]  Multiple Vitamin (MULTIVITAMIN) tablet Take 1 tablet by mouth as needed.     [provider]    Family History Family History  Problem Relation Age of Onset  . Heart disease Mother        MI  . Hypertension Mother   . Diabetes Mother   . Hyperlipidemia Mother   . Stroke Father   . Hypertension Father   . Diabetes Father   . Anemia Father   . Prostate cancer Father   . Diabetes Sister   . Vitiligo Sister   . Diabetes Brother   . Cancer Brother        Skin  . Alcohol abuse Brother   . Heart disease Brother        Pacemaker  . Prostate cancer Brother   . Depression Neg Hx   . Colon cancer Neg Hx   . Colon polyps Neg Hx   . Stomach cancer Neg Hx   . Rectal cancer  Neg Hx     Social History Social History   Tobacco Use  . Smoking status: Never Smoker  . Smokeless tobacco: Never Used  Substance Use Topics  . Alcohol use: No    Alcohol/week: 0.0 standard drinks  . Drug use: No     Allergies   Patient has no known allergies.   Review of Systems Review of Systems  Constitutional:       Per HPI, otherwise negative  HENT:       Per HPI, otherwise negative  Respiratory:       Per HPI, otherwise negative  Cardiovascular:       Per HPI, otherwise negative  Gastrointestinal: Positive for nausea. Negative for vomiting.  Endocrine:       Negative aside from HPI  Genitourinary:       Neg aside from HPI   Musculoskeletal:       Per HPI, otherwise negative  Skin: Negative.   Neurological: Negative for syncope.      Physical Exam Updated Vital Signs BP (!) 153/77   Pulse (!) 58   Temp 98.3 F (36.8 C) (Oral)   Resp 17   SpO2 98%   Physical Exam Vitals signs and nursing note reviewed.  Constitutional:      General: He is not in acute distress.    Appearance: He is well-developed.  HENT:     Head: Normocephalic and atraumatic.  Eyes:     Conjunctiva/sclera: Conjunctivae normal.  Cardiovascular:     Rate and Rhythm: Normal rate and regular rhythm.  Pulmonary:     Effort: Pulmonary effort is normal. No respiratory distress.     Breath sounds: No stridor.  Abdominal:     General: There is no distension.     Tenderness: There is abdominal tenderness. There is no guarding.     Comments: No guarding, no rebound, but patient indicated this is the area of highest pain (R lateral abd)  Skin:    General: Skin is warm and dry.  Neurological:     Mental Status: He is alert and oriented to person, place, and time.      ED Treatments / Results  Labs (all labs ordered are listed, but only abnormal results are displayed) Labs Reviewed  URINALYSIS, ROUTINE W REFLEX MICROSCOPIC - Abnormal; Notable for the following components:      Result Value   Glucose, UA >=500 (*)    Ketones, ur 5 (*)    All other components within normal limits  BASIC METABOLIC PANEL - Abnormal; Notable for the following components:   Glucose, Bld 288 (*)    Calcium 8.8 (*)    All other components within normal limits  CBC - Abnormal; Notable for the following components:   WBC 11.7 (*)    All other components within normal limits    EKG None  Radiology Ct Renal Stone Study  Result Date: 08/01/2019 CLINICAL DATA:  63 year old with right flank pain. Kidney stone history. EXAM: CT ABDOMEN AND PELVIS WITHOUT CONTRAST TECHNIQUE: Multidetector CT imaging of the abdomen and pelvis was performed following the standard protocol without IV contrast. COMPARISON:  09/03/2007 FINDINGS: Lower chest: Lung bases are clear.  Hepatobiliary: Diffuse low-attenuation of the liver is compatible with steatosis. Normal appearance of the gallbladder. Pancreas: Unremarkable. No pancreatic ductal dilatation or surrounding inflammatory changes. Spleen: Normal in size without focal abnormality. Adrenals/Urinary Tract: Probable small nodule in the left adrenal gland with Hounsfield units measuring 10. Findings are compatible with a small  benign adenoma. Normal appearance of the right adrenal gland. Normal appearance of the left kidney without left kidney stones. Negative for left hydronephrosis. Urinary bladder is decompressed. Exophytic hypodensity at the right kidney upper pole is water attenuation and measures roughly 2.1 cm. This measured roughly 1.5 cm in 2008. Findings are suggestive for a slightly enlarged renal cyst. Mild right hydroureteronephrosis with a 4 mm stone in the distal right ureter near the ureterovesical junction. Mild perinephric stranding, right side greater than left. No stones within the right kidney. Stomach/Bowel: Stomach is within normal limits. Appendix appears normal. No evidence of bowel wall thickening, distention, or inflammatory changes. Vascular/Lymphatic: Small amount of wall calcifications in the iliac arteries. Negative for an aortic aneurysm. No abdominopelvic lymphadenopathy. Reproductive: Prostate is unremarkable. Other: Negative for ascites.  Negative for free air. Musculoskeletal: Joint space narrowing in both hips. Degenerative facet arthropathy at L4-L5 and L5-S1. IMPRESSION: 1. Mild right hydroureteronephrosis secondary to a 4 mm stone in the distal right ureter near the right ureterovesical junction. No other kidney or ureter stones. 2. Hepatic steatosis. 3. Probable right renal cyst. Electronically Signed   By: Markus Daft M.D.   On: 08/01/2019 09:34    Procedures Procedures (including critical care time)  Medications Ordered in ED Medications  ketorolac (TORADOL) 15 MG/ML injection 15 mg (has  no administration in time range)  sodium chloride 0.9 % bolus 1,000 mL (1,000 mLs Intravenous New Bag/Given 08/01/19 0841)  ondansetron (ZOFRAN) injection 4 mg (4 mg Intravenous Given 08/01/19 0831)  morphine 4 MG/ML injection 4 mg (4 mg Intravenous Given 08/01/19 0831)     Initial Impression / Assessment and Plan / ED Course  I have reviewed the triage vital signs and the nursing notes.  Pertinent labs & imaging results that were available during my care of the patient were reviewed by me and considered in my medical decision making (see chart for details).        10:34 AM Patient substantially better, in no distress, no pain. He is now accompanied by his wife.  I we discussed all findings, including CT demonstrating 4 mm stone, right UVJ. No evidence for concurrent infection, with reassuring labs, urinalysis. With resolution of pain, patient will start anti-inflammatories, will follow-up with urology for new diagnosis of nephrolithiasis.  Final Clinical Impressions(s) / ED Diagnoses   Final diagnoses:  Nephrolithiasis    ED Discharge Orders         Ordered    tamsulosin (FLOMAX) 0.4 MG CAPS capsule  Daily     08/01/19 1035           Carmin Muskrat, MD 08/01/19 1036

## 2019-08-02 ENCOUNTER — Telehealth: Payer: Self-pay | Admitting: *Deleted

## 2019-08-02 ENCOUNTER — Telehealth: Payer: Self-pay | Admitting: Family Medicine

## 2019-08-02 ENCOUNTER — Encounter: Payer: Self-pay | Admitting: Family Medicine

## 2019-08-02 DIAGNOSIS — K760 Fatty (change of) liver, not elsewhere classified: Secondary | ICD-10-CM | POA: Insufficient documentation

## 2019-08-02 NOTE — Telephone Encounter (Signed)
TOC CM received call from wife stating pt was in pain and that recommended Tylenol or Ibuprofen was not working. She wanted to see if the MD could call in pain med. Message sent to ED provider. Explained to wife it pt having worsening symptoms to seek medical attention. [place], [place] ED TOC CM [phone]

## 2019-08-02 NOTE — Telephone Encounter (Signed)
Please check on patient.  I saw the emergency room notes.  I hope he is feeling better.  Let me know if he needs help getting set up with urology.    Is he going to follow-up here about his diabetes?  His sugar was almost 300 the emergency room.  If he is willing to set it up, please schedule him for diabetes.  We can check an A1c at that visit.  Thanks.

## 2019-08-04 MED ORDER — OXYCODONE HCL 5 MG PO TABS: 5.0000 mg | ORAL_TABLET | Freq: Four times a day (QID) | ORAL | 0 refills | Status: DC | PRN

## 2019-08-04 NOTE — Telephone Encounter (Signed)
I sent rx for oxycodone to the pharmacy.  Sedation caution.  Continue PO fluids and if not controlling pain with that then needs to get rechecked.  I hope he feels better soon.  Thanks.

## 2019-08-04 NOTE — Telephone Encounter (Signed)
[facility] - Client TELEPHONE ADVICE RECORD [facility] Patient Name: Philip Callahan Gender: Male DOB: 04-15-1956 Age: 63 Y 53 M 10 D Return Phone Number: [phone] (Primary) Address[place] [address] Client [facility] - Client Client Site [place] Physician Renford Dills - MD Contact Type Call Who Is Calling Patient / Member / Family / Caregiver Call Type Triage / Clinical Caller Name Erich Ricke Relationship To Patient Spouse Return Phone Number [phone] (Primary) Chief Complaint Abdominal Pain Reason for Call Symptomatic / Request for Health Information Initial Comment Her husband was seen yesterday in ER for kidney stones. Tylenol/ibuprofen not working. Wants to know if something can be called in for him. Translation No Nurse Assessment Nurse: Kayleen Memos, RN, Kathrine Date/Time (Eastern Time): 08/02/2019 5:27:15 PM Confirm and document reason for call. If symptomatic, describe symptoms. ---Caller states the Pt was seen in the ED, and Dx with kidney stones. Caller states that the pain is severe, and unable to control it with Tylenol/Ibuprofen. Caller states that she called the ED, and was told to call the PCP for Rx for pain medications. Has the patient had close contact with a person known or suspected to have the novel coronavirus illness OR traveled / lives in area with major community spread (including international travel) in the last 14 days from the onset of symptoms? * If Asymptomatic, screen for exposure and travel within the last 14 days. ---No Does the patient have any new or worsening symptoms? ---Yes Will a triage be completed? ---Yes Related visit to physician within the last 2 weeks? ---Yes Does the PT have any chronic conditions? (i.e. diabetes, asthma, this includes High risk factors for pregnancy, etc.) ---No Is this a behavioral health  or substance abuse call? ---No Guidelines Guideline Title Affirmed Question Affirmed Notes Nurse Date/Time (Eastern Time) Flank Pain [1] SEVERE pain (e.g., excruciating, scale 8-10) AND [2] not improved after pain medicine Kayleen Memos, RN, Kathrine 08/02/2019 5:29:36 PM Disp. Time Eilene Ghazi Time) Disposition Final User PLEASE NOTE: All timestamps contained within this report are represented as [place] Standard Time. CONFIDENTIALTY NOTICE: This fax transmission is intended only for the addressee. It contains information that is legally privileged, confidential or otherwise protected from use or disclosure. If you are not the intended recipient, you are strictly prohibited from reviewing, disclosing, copying using or disseminating any of this information or taking any action in reliance on or regarding this information. If you have received this fax in error, please notify us immediately by telephone so that we can arrange for its return to [place]. Phone: [phone], Toll-Free: [phone], Fax: [phone] Page: 2 of 2 Call Id: [id number] 08/02/2019 5:34:04 PM Go to ED Now Yes Kayleen Memos, RN, Kathrine Caller Disagree/Comply Disagree Caller Understands Yes PreDisposition Call Pharmacist Care Advice Given Per Guideline GO TO ED NOW: * You need to be seen in the Emergency Department. * Go to the ED at ___________ [place] now. Drive carefully. DRIVING: Another adult should drive. * Please bring a list of your current medicines when you go to the Emergency Department (ER). * It is also a good idea to bring the pill bottles too. This will help the doctor to make certain you are taking the right medicines and the right dose. CARE ADVICE given per Flank Pain (Adult) guideline. Comments User: Patrick Jupiter, RN Date/Time (Eastern Time): 08/02/2019 5:33:59 PM Caller ended the call when informed that the Pt should go to the  ED. This nurse unable to get Rx for narcotic after hours, and directives do  not allow. Caller ended the call. Referrals GO TO FACILITY REFUSED GO TO FACILITY UNDECIDED

## 2019-08-04 NOTE — Telephone Encounter (Signed)
Patient states he is in severe pain with kidney stones and the Tylenol and Motrin isn't helping.  Scheduled patient to see Dr. Glori Bickers September 9.  Patient says he will need help getting scheduled with urology.

## 2019-08-04 NOTE — Addendum Note (Signed)
Addended by: Tonia Ghent on: 08/04/2019 05:22 PM   Modules accepted: Orders

## 2019-08-04 NOTE — Telephone Encounter (Signed)
[facility] Night - Client TELEPHONE ADVICE RECORD [facility] Patient Name: Philip Callahan Gender: Male DOB: 05-Dec-1955 Age: 63 Y 15 M 10 D Return Phone Number: [phone] (Primary) Address[place] [address] Client [facility] - Client Client Site [place] Physician Renford Dills - MD Contact Type Call Who Is Calling Patient / Member / Family / Caregiver Call Type Triage / Clinical Caller Name Philip Callahan Relationship To Patient Spouse Return Phone Number [phone] (Primary) Chief Complaint ABDOMINAL PAIN - Severe and only in abdomen Reason for Call Symptomatic / Request for Health Information Initial Comment She is needing to speak with the on-call about her husband. He has kidney stones and they are needing medication for it. Tried to go to the ER but they were unable to help. He is unable to sleep, eat or do anything due to the pain. Translation No Nurse Assessment Nurse: Nicki Reaper, RN, Malachy Mood Date/Time (Eastern Time): 08/02/2019 5:48:16 PM Confirm and document reason for call. If symptomatic, describe symptoms. ---Caller states her husband was seen in ED yesterday and diagnosed with Kidney Stones, no pain medication was given, he was instructed to take Tylenol and Motrin, has taken medications without relief, rates pain 10/10, pain located in R side, unable to sleep, eat, stand or lay due to pain, denies other symptoms, drinking fluids, last urine output was yesterday in ED Has the patient had close contact with a person known or suspected to have the novel coronavirus illness OR traveled / lives in area with major community spread (including international travel) in the last 14 days from the onset of symptoms? * If Asymptomatic, screen for exposure and travel within the last 14 days. ---No Does the patient have any new or worsening symptoms? ---Yes Will a triage  be completed? ---Yes Related visit to physician within the last 2 weeks? ---Yes Does the PT have any chronic conditions? (i.e. diabetes, asthma, this includes High risk factors for pregnancy, etc.) ---No Is this a behavioral health or substance abuse call? ---No PLEASE NOTE: All timestamps contained within this report are represented as [place] Standard Time. CONFIDENTIALTY NOTICE: This fax transmission is intended only for the addressee. It contains information that is legally privileged, confidential or otherwise protected from use or disclosure. If you are not the intended recipient, you are strictly prohibited from reviewing, disclosing, copying using or disseminating any of this information or taking any action in reliance on or regarding this information. If you have received this fax in error, please notify us immediately by telephone so that we can arrange for its return to [place]. Phone: [phone], Toll-Free: [phone], Fax: [phone] Page: 2 of 2 Call Id: [id number] Guidelines Guideline Title Affirmed Question Affirmed Notes Nurse Date/Time Eilene Ghazi Time) Recent Medical Visit for Illness Follow-up Call [1] SEVERE pain (e.g., excruciating, pain scale 8-10) AND [2] not improved after pain medications Nicki Reaper, RN, Malachy Mood 08/02/2019 5:50:58 PM Disp. Time Eilene Ghazi Time) Disposition Final User 08/02/2019 5:47:09 PM Send to Urgent Lestine Mount 08/02/2019 5:57:32 PM Call PCP Now Yes Nicki Reaper, RN, Erskine Speed Disagree/Comply Disagree Caller Understands Yes PreDisposition Go to ED Care Advice Given Per Guideline CALL PCP NOW: * You need to discuss this with your doctor (or NP/PA). ALTERNATE DISPOSITION - CALL EMERGENCY DEPARTMENT: * If seen within the last 72 hours (3 days) in ED, consider calling ED now for advice on what to do for changing condition. * Some Emergency Departments will provide medical advice  for patients recently discharged from their ED. CALL BACK IF: * You become  worse. After Care Instructions Given Call Event Type User Date / Time Description Education document email Baltazar Apo 08/02/2019 5:56:44 PM [facility] Connect Now Instructions Comments User: Burna Sis, RN Date/Time Eilene Ghazi Time): 08/02/2019 5:58:20 PM Caller states she is not taking husband back to ED emailed instructions for Evisit Referrals [place] UNDECIDED

## 2019-08-04 NOTE — Telephone Encounter (Signed)
Patient advised.

## 2019-08-05 ENCOUNTER — Ambulatory Visit: Payer: PPO | Admitting: Family Medicine

## 2019-08-06 ENCOUNTER — Ambulatory Visit (INDEPENDENT_AMBULATORY_CARE_PROVIDER_SITE_OTHER): Payer: PPO | Admitting: Family Medicine

## 2019-08-06 ENCOUNTER — Encounter: Payer: Self-pay | Admitting: Family Medicine

## 2019-08-06 ENCOUNTER — Other Ambulatory Visit: Payer: Self-pay

## 2019-08-06 VITALS — BP 128/98 | HR 109 | Temp 98.0°F | Ht 72.0 in | Wt 228.4 lb

## 2019-08-06 DIAGNOSIS — E119 Type 2 diabetes mellitus without complications: Secondary | ICD-10-CM | POA: Diagnosis not present

## 2019-08-06 DIAGNOSIS — N200 Calculus of kidney: Secondary | ICD-10-CM | POA: Diagnosis not present

## 2019-08-06 LAB — POCT GLYCOSYLATED HEMOGLOBIN (HGB A1C): Hemoglobin A1C: 8.9 % — AB (ref 4.0–5.6)

## 2019-08-06 MED ORDER — METFORMIN HCL 500 MG PO TABS: 500.0000 mg | ORAL_TABLET | Freq: Every day | ORAL | 1 refills | Status: DC

## 2019-08-06 MED ORDER — OXYCODONE HCL 5 MG PO TABS: 5.0000 mg | ORAL_TABLET | Freq: Four times a day (QID) | ORAL | 0 refills | Status: DC | PRN

## 2019-08-06 NOTE — Patient Instructions (Addendum)
We'll call about a urology appointment.  Use the pain medicine if needed.  Keep taking prune juice and use miralax if needed- that is OTC.   Keep drinking plenty of water.   Strain your urine.   Recheck A1c in about 3 months at a visit.   Keep avoiding carbs.   Exercise as best you can, after passing the stone.    Check with your insurance to see what meter/strips are covered.   After passing the stone, start taking metformin 500mg  a day and update me about your sugar about 1 week after that.   Take care.  Glad to see you.

## 2019-08-06 NOTE — Progress Notes (Signed)
Diabetes:  No meds.  Hypoglycemic episodes: no Hyperglycemic episodes: he had polyuria  Feet problems: some tingling.   Blood Sugars averaging: not checked.  a1c d/w pt.   Diet and exercise d/w pt.  "I had slacked off" before the kidney stone.  Gym closure with covid d/w pt.    Kidney stone.  Seen at ER.  Hasn't passed a stone yet.  This is his 3rd stone.  Still with R sided pain. He is dealing with constipation, last BM on 08/03/2019.  Took prune juice this AM.  He has some sweats with episodes of pain but no fevers otherwise.  Previous imaging discussed with patient.  1. Mild right hydroureteronephrosis secondary to a 4 mm stone in the distal right ureter near the right ureterovesical junction. No other kidney or ureter stones. 2. Hepatic steatosis. 3. Probable right renal cyst.  PMH and SH reviewed  Meds, vitals, and allergies reviewed.   ROS: Per HPI unless specifically indicated in ROS section   GEN: nad, alert and oriented HEENT: ncat NECK: supple w/o LA CV: rrr. PULM: ctab, no inc wob ABD: soft, +bs EXT: no edema SKIN: no acute rash R flank ttp w/o rebound.    Diabetic foot exam: Normal inspection No skin breakdown No calluses  Normal DP pulses Normal sensation to light touch and monofilament Nails normal

## 2019-08-09 NOTE — Assessment & Plan Note (Signed)
We'll call about a urology appointment.  He can use his pain medicine if needed.  Opiate cautions discussed. He can keep taking prune juice and use miralax if needed- that is OTC.  Advised to keep drinking plenty of water.   He can strain his urine to see if he can get a stone for analysis. >25 minutes spent in face to face time with patient, >50% spent in counselling or coordination of care

## 2019-08-09 NOTE — Assessment & Plan Note (Addendum)
a1c up, d/w pt at [facility].  Recheck A1c in about 3 months at a visit.   Keep avoiding carbs.  Discussed with patient. I want him to exercise as best he can, after passing the stone.    I want him to check with his insurance to see what meter/strips are covered.   After passing the stone, start taking metformin 500mg  a day and update me about sugar about 1 week after that.  We can address other health maintenance items as we go along. He agrees.

## 2019-08-10 ENCOUNTER — Telehealth: Payer: Self-pay | Admitting: Family Medicine

## 2019-08-10 NOTE — Telephone Encounter (Signed)
Pt's wife came by office to leave a message for Dr. Damita Dunnings. States pt's insurance recommends the following diabetic monitoring systems: OneTouch, Precision, Free Style. Placed note in RX tower stating this.

## 2019-08-11 NOTE — Telephone Encounter (Signed)
Please send then prescription for 1 of the covered meters. Please send prescription for 100 lancets and strips.  3 refills on both. Check sugar daily as needed. Dx E11.9.    Please have him update me about his sugar about 1 week after he starts taking metformin.  Thanks.

## 2019-08-13 ENCOUNTER — Other Ambulatory Visit: Payer: Self-pay | Admitting: *Deleted

## 2019-08-13 MED ORDER — ONETOUCH ULTRASOFT LANCETS MISC: 3 refills | Status: DC

## 2019-08-13 MED ORDER — ONETOUCH ULTRA VI STRP: ORAL_STRIP | 3 refills | Status: DC

## 2019-08-13 MED ORDER — ONETOUCH ULTRA 2 W/DEVICE KIT: PACK | 0 refills | Status: DC

## 2019-08-13 NOTE — Telephone Encounter (Signed)
Glucometer kit, lancets and strips sent to pharmacy.

## 2019-09-09 ENCOUNTER — Ambulatory Visit: Payer: Self-pay | Admitting: Urology

## 2019-09-17 ENCOUNTER — Ambulatory Visit
Admission: RE | Admit: 2019-09-17 | Discharge: 2019-09-17 | Disposition: A | Discharge status: Home / Self Care | Payer: PPO | Source: Ambulatory Visit | Attending: Urology | Admitting: Urology

## 2019-09-17 ENCOUNTER — Telehealth: Payer: Self-pay

## 2019-09-17 ENCOUNTER — Encounter: Payer: Self-pay | Admitting: Urology

## 2019-09-17 ENCOUNTER — Ambulatory Visit: Payer: PPO | Admitting: Urology

## 2019-09-17 ENCOUNTER — Other Ambulatory Visit: Payer: Self-pay

## 2019-09-17 VITALS — BP 139/76 | HR 69 | Ht 72.0 in | Wt 220.0 lb

## 2019-09-17 DIAGNOSIS — N200 Calculus of kidney: Secondary | ICD-10-CM

## 2019-09-17 DIAGNOSIS — N201 Calculus of ureter: Secondary | ICD-10-CM | POA: Diagnosis not present

## 2019-09-17 NOTE — Patient Instructions (Signed)
Dietary Guidelines to Help Prevent Kidney Stones Kidney stones are deposits of minerals and salts that form inside your kidneys. Your risk of developing kidney stones may be greater depending on your diet, your lifestyle, the medicines you take, and whether you have certain medical conditions. Most people can reduce their chances of developing kidney stones by following the instructions below. Depending on your overall health and the type of kidney stones you tend to develop, your dietitian may give you more specific instructions. What are tips for following this plan? Reading food labels  Choose foods with "no salt added" or "low-salt" labels. Limit your sodium intake to less than 1500 mg per day.  Choose foods with calcium for each meal and snack. Try to eat about 300 mg of calcium at each meal. Foods that contain 200-500 mg of calcium per serving include: ? 8 oz (237 ml) of milk, fortified nondairy milk, and fortified fruit juice. ? 8 oz (237 ml) of kefir, yogurt, and soy yogurt. ? 4 oz (118 ml) of tofu. ? 1 oz of cheese. ? 1 cup (300 g) of dried figs. ? 1 cup (91 g) of cooked broccoli. ? 1-3 oz can of sardines or mackerel.  Most people need 1000 to 1500 mg of calcium each day. Talk to your dietitian about how much calcium is recommended for you. Shopping  Buy plenty of fresh fruits and vegetables. Most people do not need to avoid fruits and vegetables, even if they contain nutrients that may contribute to kidney stones.  When shopping for convenience foods, choose: ? Whole pieces of fruit. ? Premade salads with dressing on the side. ? Low-fat fruit and yogurt smoothies.  Avoid buying frozen meals or prepared deli foods.  Look for foods with live cultures, such as yogurt and kefir. Cooking  Do not add salt to food when cooking. Place a salt shaker on the table and allow each person to add his or her own salt to taste.  Use vegetable protein, such as beans, textured vegetable  protein (TVP), or tofu instead of meat in pasta, casseroles, and soups. Meal planning   Eat less salt, if told by your dietitian. To do this: ? Avoid eating processed or premade food. ? Avoid eating fast food.  Eat less animal protein, including cheese, meat, poultry, or fish, if told by your dietitian. To do this: ? Limit the number of times you have meat, poultry, fish, or cheese each week. Eat a diet free of meat at least 2 days a week. ? Eat only one serving each day of meat, poultry, fish, or seafood. ? When you prepare animal protein, cut pieces into small portion sizes. For most meat and fish, one serving is about the size of one deck of cards.  Eat at least 5 servings of fresh fruits and vegetables each day. To do this: ? Keep fruits and vegetables on hand for snacks. ? Eat 1 piece of fruit or a handful of berries with breakfast. ? Have a salad and fruit at lunch. ? Have two kinds of vegetables at dinner.  Limit foods that are high in a substance called oxalate. These include: ? Spinach. ? Rhubarb. ? Beets. ? Potato chips and french fries. ? Nuts.  If you regularly take a diuretic medicine, make sure to eat at least 1-2 fruits or vegetables high in potassium each day. These include: ? Avocado. ? Banana. ? Orange, prune, carrot, or tomato juice. ? Baked potato. ? Cabbage. ? Beans and split   peas. General instructions   Drink enough fluid to keep your urine clear or pale yellow. This is the most important thing you can do.  Talk to your health care provider and dietitian about taking daily supplements. Depending on your health and the cause of your kidney stones, you may be advised: ? Not to take supplements with vitamin C. ? To take a calcium supplement. ? To take a daily probiotic supplement. ? To take other supplements such as magnesium, fish oil, or vitamin B6.  Take all medicines and supplements as told by your health care provider.  Limit alcohol intake to no  more than 1 drink a day for nonpregnant women and 2 drinks a day for men. One drink equals 12 oz of beer, 5 oz of wine, or 1 oz of hard liquor.  Lose weight if told by your health care provider. Work with your dietitian to find strategies and an eating plan that works best for you. What foods are not recommended? Limit your intake of the following foods, or as told by your dietitian. Talk to your dietitian about specific foods you should avoid based on the type of kidney stones and your overall health. Grains Breads. Bagels. Rolls. Baked goods. Salted crackers. Cereal. Pasta. Vegetables Spinach. Rhubarb. Beets. Canned vegetables. Pickles. Olives. Meats and other protein foods Nuts. Nut butters. Large portions of meat, poultry, or fish. Salted or cured meats. Deli meats. Hot dogs. Sausages. Dairy Cheese. Beverages Regular soft drinks. Regular vegetable juice. Seasonings and other foods Seasoning blends with salt. Salad dressings. Canned soups. Soy sauce. Ketchup. Barbecue sauce. Canned pasta sauce. Casseroles. Pizza. Lasagna. Frozen meals. Potato chips. French fries. Summary  You can reduce your risk of kidney stones by making changes to your diet.  The most important thing you can do is drink enough fluid. You should drink enough fluid to keep your urine clear or pale yellow.  Ask your health care provider or dietitian how much protein from animal sources you should eat each day, and also how much salt and calcium you should have each day. This information is not intended to replace advice given to you by your health care provider. Make sure you discuss any questions you have with your health care provider. Document Released: 03/09/2011 Document Revised: 03/04/2019 Document Reviewed: 10/23/2016 Elsevier Patient Education  2020 Elsevier Inc.  

## 2019-09-17 NOTE — Progress Notes (Signed)
09/17/19 8:49 AM   Philip Callahan [birth year] [id number]  Referring provider: Tonia Ghent, MD [place],  [place] [address]  CC: Nephrolithiasis  HPI: I saw Philip Callahan in urology clinic today in consultation for nephrolithiasis from Dr. Damita Dunnings.  He is a 63 year old male with a history of diabetes who presented to the ER on 08/01/2019 with right-sided flank and groin pain.  CT showed a 4 mm right distal ureteral stone, and urinalysis was noninfected.  He was discharged home with medical expulsive therapy.  He reports his pain completely resolved a few days after leaving the ER.  He denies any gross hematuria, dysuria, flank pain, urgency, frequency, fevers, or chills.  He never saw a stone pass, but he was not straining his urine.  He reports 2 prior stone episodes, both of which passed spontaneously.  His last stone event was 15 years ago.  There are no aggravating or alleviating factors.  Severity is mild.  He has a family history of prostate cancer in his father, and PSAs have all been less than 1 in the past.   PMH: Past Medical History:  Diagnosis Date  . Allergic rhinitis 03/2002  . BPH (benign prostatic hyperplasia) 03/2002  . Diabetes mellitus without complication ([place])    prediabetic per patient - no meds, diet controlled  . Heart murmur    as child, no problems  . Hyperlipemia 1990's   diet controlled  . Kidney stones    passed stones    Surgical History: Past Surgical History:  Procedure Laterality Date  . AKN  of scalp  01/2001   Dermatology: Dr. Nevada Crane  . COLONOSCOPY  07/2007    Allergies: No Known Allergies  Family History: Family History  Problem Relation Age of Onset  . Heart disease Mother        MI  . Hypertension Mother   . Diabetes Mother   . Hyperlipidemia Mother   . Stroke Father   . Hypertension Father   . Diabetes Father   . Anemia Father   . Prostate cancer Father   . Diabetes Sister   . Vitiligo Sister   .  Diabetes Brother   . Cancer Brother        Skin  . Alcohol abuse Brother   . Heart disease Brother        Pacemaker  . Prostate cancer Brother   . Depression Neg Hx   . Colon cancer Neg Hx   . Colon polyps Neg Hx   . Stomach cancer Neg Hx   . Rectal cancer Neg Hx     Social History:  reports that he has never smoked. He has never used smokeless tobacco. He reports that he does not drink alcohol or use drugs.  ROS: Please see flowsheet from today's date for complete review of systems.  Physical Exam: BP 139/76   Pulse 69   Ht 6' (1.829 m)   Wt 220 lb (99.8 kg)   BMI 29.84 kg/m    Constitutional:  Alert and oriented, No acute distress. Cardiovascular: No clubbing, cyanosis, or edema. Respiratory: Normal respiratory effort, no increased work of breathing. GI: Abdomen is soft, nontender, nondistended, no abdominal masses GU: No CVA tenderness Lymph: No cervical or inguinal lymphadenopathy. Skin: No rashes, bruises or suspicious lesions. Neurologic: Grossly intact, no focal deficits, moving all 4 extremities. Psychiatric: Normal mood and affect.  Laboratory Data: Urinalysis today 0-5 WBCs, 0 RBCs, no bacteria, nitrite negative  Pertinent Imaging:  See HPI  Assessment & Plan:   In summary, the patient is a 63 year old male with 2 prior episodes of nephrolithiasis, and likely spontaneously passed right 4 mm distal ureteral stone.  He is completely asymptomatic today and urinalysis is benign.  We discussed general stone prevention strategies including adequate hydration with goal of producing 2.5 L of urine daily, increasing citric acid intake, increasing calcium intake during high oxalate meals, minimizing animal protein, and decreasing salt intake. Information about dietary recommendations given today.   KUB today to confirm stone passage, will call with results RTC as needed  Billey Co, [place] [address], [place]  [place], [place] [address] [phone]

## 2019-09-17 NOTE — Telephone Encounter (Signed)
Patient notified

## 2019-09-17 NOTE — Telephone Encounter (Signed)
-----   Message from Billey Co, MD sent at 09/17/2019  9:46 AM EDT ----- Looks like he passed his stone, not visible on xray and no microscopic blood in urine, follow up PRN  [name], MD 09/17/2019

## 2019-09-18 LAB — MICROSCOPIC EXAMINATION
Bacteria, UA: NONE SEEN
Epithelial Cells (non renal): NONE SEEN /hpf (ref 0–10)
RBC, Urine: NONE SEEN /hpf (ref 0–2)

## 2019-09-18 LAB — URINALYSIS, COMPLETE
Bilirubin, UA: NEGATIVE
Glucose, UA: NEGATIVE
Ketones, UA: NEGATIVE
Leukocytes,UA: NEGATIVE
Nitrite, UA: NEGATIVE
Protein,UA: NEGATIVE
RBC, UA: NEGATIVE
Specific Gravity, UA: >1.03 — ABNORMAL HIGH (ref 1.005–1.030)
Urobilinogen, Ur: 0.2 mg/dL (ref 0.2–1.0)
pH, UA: 5 (ref 5.0–7.5)

## 2019-10-30 ENCOUNTER — Ambulatory Visit (INDEPENDENT_AMBULATORY_CARE_PROVIDER_SITE_OTHER): Payer: PPO | Admitting: Family Medicine

## 2019-10-30 ENCOUNTER — Other Ambulatory Visit: Payer: Self-pay

## 2019-10-30 ENCOUNTER — Encounter: Payer: Self-pay | Admitting: Family Medicine

## 2019-10-30 VITALS — BP 120/60 | HR 75 | Temp 97.5°F | Ht 72.0 in | Wt 218.5 lb

## 2019-10-30 DIAGNOSIS — Z23 Encounter for immunization: Secondary | ICD-10-CM

## 2019-10-30 DIAGNOSIS — E119 Type 2 diabetes mellitus without complications: Secondary | ICD-10-CM

## 2019-10-30 LAB — POCT GLYCOSYLATED HEMOGLOBIN (HGB A1C): Hemoglobin A1C: 7.3 % — AB (ref 4.0–5.6)

## 2019-10-30 NOTE — Patient Instructions (Signed)
Flu shot today.  Thanks for your effort.  Your sugar is way better.  Have a great trip.  Plan on a physical in about 3-4 months, labs ahead of time.  Take care.  Glad to see you.

## 2019-10-30 NOTE — Progress Notes (Signed)
This visit occurred during the SARS-CoV-2 public health emergency.  Safety protocols were in place, including screening questions prior to the visit, additional usage of staff PPE, and extensive cleaning of exam room while observing appropriate contact time as indicated for disinfecting solutions.   Diabetes:  No meds.  Hypoglycemic episodes:no Hyperglycemic episodes:no Feet problems:no Blood Sugars averaging: 100-110 eye exam within last year: due, d/w pt.   A1c improved to 7.3, d/w pt at [facility].  He has been working on diet and exercise.  He has been swimming 2-4 times a week.   Intentional weight loss.    He is travelling to [place] to see his son, d/w pt.  He was tested yesterday for COVID.  He has made arrangements for malaria proph.    PMH and SH reviewed Meds, vitals, and allergies reviewed.   ROS: Per HPI unless specifically indicated in ROS section   GEN: nad, alert and oriented HEENT: ncat NECK: supple w/o LA CV: rrr. PULM: ctab, no inc wob ABD: soft, +bs EXT: no edema SKIN: well perfused.

## 2019-11-01 NOTE — Assessment & Plan Note (Signed)
A1c improved to 7.3, d/w pt at [facility].  He has been working on diet and exercise.  He has been swimming 2-4 times a week.   Intentional weight loss.   Recheck periodically.  I thanked him for his effort.  See after visit summary.

## 2019-11-05 ENCOUNTER — Ambulatory Visit: Payer: PPO | Admitting: Family Medicine

## 2020-02-15 ENCOUNTER — Other Ambulatory Visit: Payer: Self-pay | Admitting: Family Medicine

## 2020-02-15 DIAGNOSIS — Z125 Encounter for screening for malignant neoplasm of prostate: Secondary | ICD-10-CM

## 2020-02-15 DIAGNOSIS — E559 Vitamin D deficiency, unspecified: Secondary | ICD-10-CM

## 2020-02-15 DIAGNOSIS — E119 Type 2 diabetes mellitus without complications: Secondary | ICD-10-CM

## 2020-02-16 ENCOUNTER — Other Ambulatory Visit (INDEPENDENT_AMBULATORY_CARE_PROVIDER_SITE_OTHER): Payer: PPO

## 2020-02-16 ENCOUNTER — Ambulatory Visit (INDEPENDENT_AMBULATORY_CARE_PROVIDER_SITE_OTHER): Payer: PPO

## 2020-02-16 ENCOUNTER — Other Ambulatory Visit: Payer: Self-pay

## 2020-02-16 DIAGNOSIS — Z0000 Encounter for general adult medical examination without abnormal findings: Secondary | ICD-10-CM

## 2020-02-16 DIAGNOSIS — E119 Type 2 diabetes mellitus without complications: Secondary | ICD-10-CM

## 2020-02-16 DIAGNOSIS — E559 Vitamin D deficiency, unspecified: Secondary | ICD-10-CM

## 2020-02-16 DIAGNOSIS — Z125 Encounter for screening for malignant neoplasm of prostate: Secondary | ICD-10-CM | POA: Diagnosis not present

## 2020-02-16 LAB — COMPREHENSIVE METABOLIC PANEL
ALT: 15 U/L (ref 0–53)
AST: 16 U/L (ref 0–37)
Albumin: 4 g/dL (ref 3.5–5.2)
Alkaline Phosphatase: 74 U/L (ref 39–117)
BUN: 13 mg/dL (ref 6–23)
CO2: 29 mEq/L (ref 19–32)
Calcium: 9 mg/dL (ref 8.4–10.5)
Chloride: 105 mEq/L (ref 96–112)
Creatinine, Ser: 1.12 mg/dL (ref 0.40–1.50)
GFR: 79.37 mL/min (ref >60.00)
Glucose, Bld: 135 mg/dL — ABNORMAL HIGH (ref 70–99)
Potassium: 4.1 mEq/L (ref 3.5–5.1)
Sodium: 138 mEq/L (ref 135–145)
Total Bilirubin: 0.4 mg/dL (ref 0.2–1.2)
Total Protein: 6.8 g/dL (ref 6.0–8.3)

## 2020-02-16 LAB — MICROALBUMIN / CREATININE URINE RATIO
Creatinine,U: 237.9 mg/dL
Microalb Creat Ratio: 0.4 mg/g (ref 0.0–30.0)
Microalb, Ur: 0.9 mg/dL (ref 0.0–1.9)

## 2020-02-16 LAB — VITAMIN D 25 HYDROXY (VIT D DEFICIENCY, FRACTURES): VITD: 29.9 ng/mL — ABNORMAL LOW (ref 30.00–100.00)

## 2020-02-16 LAB — LIPID PANEL
Cholesterol: 220 mg/dL — ABNORMAL HIGH (ref 0–200)
HDL: 37.6 mg/dL — ABNORMAL LOW (ref >39.00)
NonHDL: 182.42
Total CHOL/HDL Ratio: 6
Triglycerides: 322 mg/dL — ABNORMAL HIGH (ref 0.0–149.0)
VLDL: 64.4 mg/dL — ABNORMAL HIGH (ref 0.0–40.0)

## 2020-02-16 LAB — LDL CHOLESTEROL, DIRECT: Direct LDL: 103 mg/dL

## 2020-02-16 LAB — PSA, MEDICARE: PSA: 0.31 ng/ml (ref 0.10–4.00)

## 2020-02-16 LAB — HEMOGLOBIN A1C: Hgb A1c MFr Bld: 6.6 % — ABNORMAL HIGH (ref 4.6–6.5)

## 2020-02-16 NOTE — Patient Instructions (Signed)
Philip Callahan , Thank you for taking time to come for your Medicare Wellness Visit. I appreciate your ongoing commitment to your health goals. Please review the following plan we discussed and let me know if I can assist you in the future.   Screening recommendations/referrals: Colonoscopy: Up to date, completed 11/06/2017 Recommended yearly ophthalmolog/y/optometry visit for glaucoma screening and checkup Recommended yearly dental visit for hygiene and checkup/  Vaccinations: Influenza vaccine: Up to date, completed 10/30/2019 Pneumococcal vaccine: due Tdap vaccine: Up to date, completed 11/03/2014 Shingles vaccine: discussed    Advanced directives: Advance directive discussed with you today. Even though you declined this today please call our office should you change your mind and we can give you the proper paperwork for you to fill out.   Conditions/risks identified: Diabetes  Next appointment: 02/19/2020 @ 9:45 am   Preventive Care 65 Years and Older, Male Preventive care refers to lifestyle choices and visits with your health care provider that can promote health and wellness. What does preventive care include?  A yearly physical exam. This is also called an annual well check.  Dental exams once or twice a year.  Routine eye exams. Ask your health care provider how often you should have your eyes checked.  Personal lifestyle choices, including:  Daily care of your teeth and gums.  Regular physical activity.  Eating a healthy diet.  Avoiding tobacco and drug use.  Limiting alcohol use.  Practicing safe sex.  Taking low doses of aspirin every day.  Taking vitamin and mineral supplements as recommended by your health care provider. What happens during an annual well check? The services and screenings done by your health care provider during your annual well check will depend on your age, overall health, lifestyle risk factors, and family history of disease. Counseling    Your health care provider may ask you questions about your:  Alcohol use.  Tobacco use.  Drug use.  Emotional well-being.  Home and relationship well-being.  Sexual activity.  Eating habits.  History of falls.  Memory and ability to understand (cognition).  Work and work Statistician. Screening  You may have the following tests or measurements:  Height, weight, and BMI.  Blood pressure.  Lipid and cholesterol levels. These may be checked every 5 years, or more frequently if you are over 47 years old.  Skin check.  Lung cancer screening. You may have this screening every year starting at age 51 if you have a 30-pack-year history of smoking and currently smoke or have quit within the past 15 years.  Fecal occult blood test (FOBT) of the stool. You may have this test every year starting at age 54.  Flexible sigmoidoscopy or colonoscopy. You may have a sigmoidoscopy every 5 years or a colonoscopy every 10 years starting at age 59.  Prostate cancer screening. Recommendations will vary depending on your family history and other risks.  Hepatitis C blood test.  Hepatitis B blood test.  Sexually transmitted disease (STD) testing.  Diabetes screening. This is done by checking your blood sugar (glucose) after you have not eaten for a while (fasting). You may have this done every 1-3 years.  Abdominal aortic aneurysm (AAA) screening. You may need this if you are a current or former smoker.  Osteoporosis. You may be screened starting at age 78 if you are at high risk. Talk with your health care provider about your test results, treatment options, and if necessary, the need for more tests. Vaccines  Your health care  provider may recommend certain vaccines, such as:  Influenza vaccine. This is recommended every year.  Tetanus, diphtheria, and acellular pertussis (Tdap, Td) vaccine. You may need a Td booster every 10 years.  Zoster vaccine. You may need this after age  8.  Pneumococcal 13-valent conjugate (PCV13) vaccine. One dose is recommended after age 63.  Pneumococcal polysaccharide (PPSV23) vaccine. One dose is recommended after age 31. Talk to your health care provider about which screenings and vaccines you need and how often you need them. This information is not intended to replace advice given to you by your health care provider. Make sure you discuss any questions you have with your health care provider. Document Released: 12/09/2015 Document Revised: 08/01/2016 Document Reviewed: 09/13/2015 Elsevier Interactive Patient Education  2017 [place] Prevention in the Home Falls can cause injuries. They can happen to people of all ages. There are many things you can do to make your home safe and to help prevent falls. What can I do on the outside of my home?  Regularly fix the edges of walkways and driveways and fix any cracks.  Remove anything that might make you trip as you walk through a door, such as a raised step or threshold.  Trim any bushes or trees on the path to your home.  Use bright outdoor lighting.  Clear any walking paths of anything that might make someone trip, such as rocks or tools.  Regularly check to see if handrails are loose or broken. Make sure that both sides of any steps have handrails.  Any raised decks and porches should have guardrails on the edges.  Have any leaves, snow, or ice cleared regularly.  Use sand or salt on walking paths during winter.  Clean up any spills in your garage right away. This includes oil or grease spills. What can I do in the bathroom?  Use night lights.  Install grab bars by the toilet and in the tub and shower. Do not use towel bars as grab bars.  Use non-skid mats or decals in the tub or shower.  If you need to sit down in the shower, use a plastic, non-slip stool.  Keep the floor dry. Clean up any water that spills on the floor as soon as it happens.  Remove  soap buildup in the tub or shower regularly.  Attach bath mats securely with double-sided non-slip rug tape.  Do not have throw rugs and other things on the floor that can make you trip. What can I do in the bedroom?  Use night lights.  Make sure that you have a light by your bed that is easy to reach.  Do not use any sheets or blankets that are too big for your bed. They should not hang down onto the floor.  Have a firm chair that has side arms. You can use this for support while you get dressed.  Do not have throw rugs and other things on the floor that can make you trip. What can I do in the kitchen?  Clean up any spills right away.  Avoid walking on wet floors.  Keep items that you use a lot in easy-to-reach places.  If you need to reach something above you, use a strong step stool that has a grab bar.  Keep electrical cords out of the way.  Do not use floor polish or wax that makes floors slippery. If you must use wax, use non-skid floor wax.  Do not have throw  rugs and other things on the floor that can make you trip. What can I do with my stairs?  Do not leave any items on the stairs.  Make sure that there are handrails on both sides of the stairs and use them. Fix handrails that are broken or loose. Make sure that handrails are as long as the stairways.  Check any carpeting to make sure that it is firmly attached to the stairs. Fix any carpet that is loose or worn.  Avoid having throw rugs at the top or bottom of the stairs. If you do have throw rugs, attach them to the floor with carpet tape.  Make sure that you have a light switch at the top of the stairs and the bottom of the stairs. If you do not have them, ask someone to add them for you. What else can I do to help prevent falls?  Wear shoes that:  Do not have high heels.  Have rubber bottoms.  Are comfortable and fit you well.  Are closed at the toe. Do not wear sandals.  If you use a  stepladder:  Make sure that it is fully opened. Do not climb a closed stepladder.  Make sure that both sides of the stepladder are locked into place.  Ask someone to hold it for you, if possible.  Clearly mark and make sure that you can see:  Any grab bars or handrails.  First and last steps.  Where the edge of each step is.  Use tools that help you move around (mobility aids) if they are needed. These include:  Canes.  Walkers.  Scooters.  Crutches.  Turn on the lights when you go into a dark area. Replace any light bulbs as soon as they burn out.  Set up your furniture so you have a clear path. Avoid moving your furniture around.  If any of your floors are uneven, fix them.  If there are any pets around you, be aware of where they are.  Review your medicines with your doctor. Some medicines can make you feel dizzy. This can increase your chance of falling. Ask your doctor what other things that you can do to help prevent falls. This information is not intended to replace advice given to you by your health care provider. Make sure you discuss any questions you have with your health care provider. Document Released: 09/08/2009 Document Revised: 04/19/2016 Document Reviewed: 12/17/2014 Elsevier Interactive Patient Education  2017 [employer].

## 2020-02-16 NOTE — Progress Notes (Signed)
PCP notes:  Health Maintenance: Prevnar 13- due, Patient received last COVID vaccine on 02/01/20   Abnormal Screenings: none   Patient concerns: none   Nurse concerns: none   Next PCP appt.: 02/19/2020 @ 9:45 am

## 2020-02-16 NOTE — Progress Notes (Signed)
Subjective:   TAGG EUSTICE is a 64 y.o. male who presents for an Initial Medicare Annual Wellness Visit.  Review of Systems: N/A    This visit is being conducted through telemedicine via telephone at the nurse health advisor's home address due to the COVID-19 pandemic. This patient has given me verbal consent via doximity to conduct this visit, patient states they are participating from their home address. Patient and myself are on the telephone call. There is no referral for this visit. Some vital signs may be absent or patient reported.    Patient identification: identified by name, DOB, and current address   Cardiac Risk Factors include: advanced age (>56mn, >>8women);diabetes mellitus;male gender    Objective:    Today's Vitals   There is no height or weight on file to calculate BMI.  Advanced Directives 02/16/2020 08/01/2019 08/01/2019 11/06/2017  Does Patient Have a Medical Advance Directive? No No No No  Would patient like information on creating a medical advance directive? No - Patient declined No - Patient declined No - Patient declined -    Current Medications (verified) Outpatient Encounter Medications as of 02/16/2020  Medication Sig  . Blood Glucose Monitoring Suppl (TRUE METRIX METER) w/Device KIT Use to test blood sugar once daily.  Dx:  E11.9  Non insulin dependent.  . Cholecalciferol 2000 units TABS Take by mouth daily as needed.  .Marland Kitchenglucose blood (TRUE METRIX BLOOD GLUCOSE TEST) test strip Use as instructed to test blood sugar once daily.  Dx:  E11.9  Non insulin dependent  . Lancets MISC True Metrix Lancets.  Use to test blood sugar once daily.  Dx:  E11.9  Non insulin dependent.  . Multiple Vitamin (MULTIVITAMIN) tablet Take 1 tablet by mouth as needed.   .Marland KitchenoxyCODONE (ROXICODONE) 5 MG immediate release tablet Take 1 tablet (5 mg total) by mouth every 6 (six) hours as needed for severe pain (for kidney stones). (Patient not taking: Reported on 02/16/2020)   No  facility-administered encounter medications on file as of 02/16/2020.    Allergies (verified) Patient has no known allergies.   History: Past Medical History:  Diagnosis Date  . Allergic rhinitis 03/2002  . BPH (benign prostatic hyperplasia) 03/2002  . [place]    prediabetic per patient - no meds, diet controlled  . Heart murmur    as child, no problems  . Hyperlipemia 1990's   diet controlled  . Kidney stones    passed stones   Past Surgical History:  Procedure Laterality Date  . AKN  of scalp  01/2001   Dermatology: Dr. HNevada Crane . COLONOSCOPY  07/2007   Family History  Problem Relation Age of Onset  . Heart disease Mother        MI  . Hypertension Mother   . Diabetes Mother   . Hyperlipidemia Mother   . Stroke Father   . Hypertension Father   . Diabetes Father   . Anemia Father   . Prostate cancer Father   . Diabetes Sister   . Vitiligo Sister   . Diabetes Brother   . Cancer Brother        Skin  . Alcohol abuse Brother   . Heart disease Brother        Pacemaker  . Prostate cancer Brother   . Depression Neg Hx   . Colon cancer Neg Hx   . Colon polyps Neg Hx   . Stomach cancer Neg Hx   .  Rectal cancer Neg Hx    Social History   Socioeconomic History  . Marital status: Married    Spouse name: Not on file  . Number of children: 2  . Years of education: Not on file  . Highest education level: Not on file  Occupational History  . Occupation: Administrator, sports: [employer]  Tobacco Use  . Smoking status: Never Smoker  . Smokeless tobacco: Never Used  Substance and Sexual Activity  . Alcohol use: No    Alcohol/week: 0.0 standard drinks  . Drug use: No  . Sexual activity: Yes  Other Topics Concern  . Not on file  Social History Narrative   Lives with wife, married 1976   1 daughter local as of 2018 and 1 son in [place] (both have done mission work)   Retired from [employer], Librarian, academic.    [employer]   Social Determinants of Health   Financial Resource Strain: Low Risk   . Difficulty of Paying Living Expenses: Not hard at all  Food Insecurity: No Food Insecurity  . Worried About Charity fundraiser in the Last Year: Never true  . Ran Out of Food in the Last Year: Never true  Transportation Needs: No Transportation Needs  . Lack of Transportation (Medical): No  . Lack of Transportation (Non-Medical): No  Physical Activity: Sufficiently Active  . Days of Exercise per Week: 6 days  . Minutes of Exercise per Session: 50 min  Stress: No Stress Concern Present  . Feeling of Stress : Not at all  Social Connections:   . Frequency of Communication with Friends and Family:   . Frequency of Social Gatherings with Friends and Family:   . Attends Religious Services:   . Active Member of Clubs or Organizations:   . Attends Archivist Meetings:   Marland Kitchen Marital Status:    Tobacco Counseling Counseling given: Not Answered   Clinical Intake:  Pre-visit preparation completed: Yes  Pain : No/denies pain     Nutritional Risks: None Diabetes: Yes CBG done?: No Did pt. bring in CBG monitor from home?: No  How often do you need to have someone help you when you read instructions, pamphlets, or other written materials from your doctor or pharmacy?: 1 - Never What is the last grade level you completed in school?: college graduate  Interpreter Needed?: No  Information entered by :: CJohnson, LPN  Activities of Daily Living In your present state of health, do you have any difficulty performing the following activities: 02/16/2020  Hearing? N  Vision? N  Difficulty concentrating or making decisions? N  Walking or climbing stairs? N  Dressing or bathing? N  Doing errands, shopping? N  Preparing Food and eating ? N  Using the Toilet? N  In the past six months, have you accidently leaked urine? N  Do you have problems with loss of bowel control? N  Managing  your Medications? N  Managing your Finances? N  Housekeeping or managing your Housekeeping? N  Some recent data might be hidden     Immunizations and Health Maintenance Immunization History  Administered Date(s) Administered  . Fluad Quad(high Dose 65+) 10/30/2019  . Influenza Split 10/11/2011, 10/14/2012  . Influenza Whole 09/23/2008, 09/26/2009, 10/05/2010  . Influenza,inj,Quad PF,6+ Mos 11/03/2014, 08/15/2017  . Td 03/31/2004  . Tdap 11/03/2014   Health Maintenance Due  Topic Date Due  . URINE MICROALBUMIN  08/15/2018  . PNA vac Low Risk Adult (1 of  2 - PCV13) Never done    Patient Care Team: Tonia Ghent, MD as PCP - General (Family Medicine)  Indicate any recent Medical Services you may have received from other than [facility] providers in the past year (date may be approximate).    Assessment:   This is a routine wellness examination for Dara.  Hearing/Vision screen  Hearing Screening   '125Hz'  '250Hz'  '500Hz'  '1000Hz'  '2000Hz'  '3000Hz'  '4000Hz'  '6000Hz'  '8000Hz'   Right ear:           Left ear:           Vision Screening Comments: Patient gets annual eye exams   Dietary issues and exercise activities discussed: Current Exercise Habits: Structured exercise class, Type of exercise: Other - see comments(swimming), Time (Minutes): 45, Frequency (Times/Week): 6, Weekly Exercise (Minutes/Week): 270, Intensity: Moderate, Exercise limited by: None identified  Goals    . Patient Stated     02/16/2020, I will continue to swim 6 days a week for 45 minutes.       Depression Screen PHQ 2/9 Scores 02/16/2020 08/15/2017  PHQ - 2 Score 0 0  PHQ- 9 Score 0 -    Fall Risk Fall Risk  02/16/2020 06/25/2019  Falls in the past year? 0 0  Comment - Emmi Telephone Survey: data to providers prior to load  Number falls in past yr: 0 -  Injury with Fall? 0 -  Risk for fall due to : No Fall Risks -  Follow up Falls evaluation completed;Falls prevention discussed -    Is the patient's home free of  loose throw rugs in walkways, pet beds, electrical cords, etc?   yes      Grab bars in the bathroom? no      Handrails on the stairs?   no      Adequate lighting?   yes  Timed Get Up and Go performed: N/A  Cognitive Function: MMSE - Mini Mental State Exam 02/16/2020  Orientation to time 5  Orientation to Place 5  Registration 3  Attention/ Calculation 5  Recall 3  Language- repeat 1       Mini Cog  Mini-Cog screen was completed. Maximum score is 22. A value of 0 denotes this part of the MMSE was not completed or the patient failed this part of the Mini-Cog screening.  Screening Tests Health Maintenance  Topic Date Due  . URINE MICROALBUMIN  08/15/2018  . PNA vac Low Risk Adult (1 of 2 - PCV13) Never done  . HEMOGLOBIN A1C  04/29/2020  . FOOT EXAM  08/05/2020  . OPHTHALMOLOGY EXAM  01/18/2021  . COLONOSCOPY  11/06/2022  . TETANUS/TDAP  11/03/2024  . INFLUENZA VACCINE  Completed  . Hepatitis C Screening  Completed  . HIV Screening  Completed    Qualifies for Shingles Vaccine: Yes   Cancer Screenings: Lung: Low Dose CT Chest recommended if Age 66-80 years, 30 pack-year currently smoking OR have quit w/in 15 years. Patient does not qualify. Colorectal: completed 11/06/2017  Additional Screenings:  Hepatitis C Screening: 08/13/2017      Plan:   Patient will continue to swim 6 days a week for 45 minutes.   I have personally reviewed and noted the following in the patient's chart:   . Medical and social history . Use of alcohol, tobacco or illicit drugs  . Current medications and supplements . Functional ability and status . Nutritional status . Physical activity . Advanced directives . List of other physicians . Hospitalizations, surgeries, and  ER visits in previous 12 months . Vitals . Screenings to include cognitive, depression, and falls . Referrals and appointments  In addition, I have reviewed and discussed with patient certain preventive protocols,  quality metrics, and best practice recommendations. A written personalized care plan for preventive services as well as general preventive health recommendations were provided to patient.     Andrez Grime, LPN   [id number]

## 2020-02-19 ENCOUNTER — Encounter: Payer: PPO | Admitting: Family Medicine

## 2020-03-01 ENCOUNTER — Encounter: Payer: PPO | Admitting: Family Medicine

## 2020-03-03 ENCOUNTER — Ambulatory Visit (INDEPENDENT_AMBULATORY_CARE_PROVIDER_SITE_OTHER): Payer: PPO | Admitting: Family Medicine

## 2020-03-03 ENCOUNTER — Other Ambulatory Visit: Payer: Self-pay

## 2020-03-03 ENCOUNTER — Encounter: Payer: Self-pay | Admitting: Family Medicine

## 2020-03-03 VITALS — BP 142/78 | HR 87 | Temp 96.5°F | Ht 72.0 in | Wt 220.4 lb

## 2020-03-03 DIAGNOSIS — E119 Type 2 diabetes mellitus without complications: Secondary | ICD-10-CM | POA: Diagnosis not present

## 2020-03-03 DIAGNOSIS — Z7189 Other specified counseling: Secondary | ICD-10-CM

## 2020-03-03 DIAGNOSIS — N200 Calculus of kidney: Secondary | ICD-10-CM

## 2020-03-03 DIAGNOSIS — Z0000 Encounter for general adult medical examination without abnormal findings: Secondary | ICD-10-CM

## 2020-03-03 MED ORDER — OXYCODONE HCL 5 MG PO TABS: 5.0000 mg | ORAL_TABLET | Freq: Four times a day (QID) | ORAL | 0 refills | Status: DC | PRN

## 2020-03-03 NOTE — Patient Instructions (Addendum)
Check with your insurance to see if they will cover the shingrix shot. Keep going as is.   Update me as needed.  Recheck in about 6 months with labs ahead of time.  Take care.  Glad to see you. Thanks for your effort.

## 2020-03-03 NOTE — Progress Notes (Signed)
This visit occurred during the SARS-CoV-2 public health emergency.  Safety protocols were in place, including screening questions prior to the visit, additional usage of staff PPE, and extensive cleaning of exam room while observing appropriate contact time as indicated for disinfecting solutions.  Diabetes:  No meds.   Hypoglycemic episodes: rare, cautions d/w pt.  Only with prolonged fasting.   Hyperglycemic episodes:no Feet problems:no Blood Sugars averaging:110-130s eye exam within last year: yes He is swimming 45 min at a time, 6 days a week.  Labs d/w pt.   Options regarding lipids discussed with patient.  See plan.  Oxycodone only used for renal stones, prn.  Initial rx printed but shredded and witnessed by L Fuquay.  Then med Erx'd.    tdap 2015 covid 2021 Flu shot 2020 Shingles d/w pt.   PNA d/w pt.  See avs.   PSA wnl.   Colonoscopy 2018 Diet and exercise.   Living will d/w pt. Would have wife designated if patient were incapacitated.   PMH and SH reviewed Meds, vitals, and allergies reviewed.   ROS: Per HPI unless specifically indicated in ROS section   GEN: nad, alert and oriented HEENT: ncat NECK: supple w/o LA CV: rrr. PULM: ctab, no inc wob ABD: soft, +bs EXT: no edema SKIN: no acute rash  Diabetic foot exam: Normal inspection No skin breakdown No calluses  Normal DP pulses Normal sensation to light touch and monofilament Nails normal  At least 30 minutes were devoted to patient care in this encounter (this can potentially include time spent reviewing the patient's file/history, interviewing and examining the patient, counseling/reviewing plan with patient, ordering referrals, ordering tests, reviewing relevant laboratory or x-ray data, and documenting the encounter).

## 2020-03-06 DIAGNOSIS — Z0000 Encounter for general adult medical examination without abnormal findings: Secondary | ICD-10-CM | POA: Insufficient documentation

## 2020-03-06 NOTE — Assessment & Plan Note (Signed)
Living will d/w pt. Would have wife designated if patient were incapacitated.  

## 2020-03-06 NOTE — Assessment & Plan Note (Signed)
tdap 2015 covid 2021 Flu shot 2020 Shingles d/w pt.   PNA d/w pt.  See avs.   PSA wnl.   Colonoscopy 2018 Diet and exercise.   Living will d/w pt. Would have wife designated if patient were incapacitated.

## 2020-03-06 NOTE — Assessment & Plan Note (Signed)
He is swimming 45 min at a time, 6 days a week.  Labs d/w pt.   A1c improved.  We can recheck lipids and A1c with next set of labs and go from there.  He agrees.

## 2020-03-06 NOTE — Assessment & Plan Note (Signed)
Oxycodone only used for renal stones, prn.  Initial rx printed but shredded and witnessed by L Fuquay.  Then med Erx'd.

## 2020-08-08 ENCOUNTER — Other Ambulatory Visit (INDEPENDENT_AMBULATORY_CARE_PROVIDER_SITE_OTHER): Payer: PPO

## 2020-08-08 ENCOUNTER — Other Ambulatory Visit: Payer: Self-pay

## 2020-08-08 DIAGNOSIS — E119 Type 2 diabetes mellitus without complications: Secondary | ICD-10-CM | POA: Diagnosis not present

## 2020-08-08 LAB — LIPID PANEL
Cholesterol: 207 mg/dL — ABNORMAL HIGH (ref 0–200)
HDL: 40.6 mg/dL (ref >39.00)
NonHDL: 166.39
Total CHOL/HDL Ratio: 5
Triglycerides: 276 mg/dL — ABNORMAL HIGH (ref 0.0–149.0)
VLDL: 55.2 mg/dL — ABNORMAL HIGH (ref 0.0–40.0)

## 2020-08-08 LAB — HEMOGLOBIN A1C: Hgb A1c MFr Bld: 7.4 % — ABNORMAL HIGH (ref 4.6–6.5)

## 2020-08-08 LAB — LDL CHOLESTEROL, DIRECT: Direct LDL: 107 mg/dL

## 2020-08-11 ENCOUNTER — Encounter: Payer: Self-pay | Admitting: Family Medicine

## 2020-08-11 ENCOUNTER — Ambulatory Visit (INDEPENDENT_AMBULATORY_CARE_PROVIDER_SITE_OTHER): Payer: PPO | Admitting: Family Medicine

## 2020-08-11 ENCOUNTER — Other Ambulatory Visit: Payer: Self-pay

## 2020-08-11 VITALS — BP 116/70 | HR 72 | Temp 95.6°F | Ht 72.0 in | Wt 224.6 lb

## 2020-08-11 DIAGNOSIS — E119 Type 2 diabetes mellitus without complications: Secondary | ICD-10-CM

## 2020-08-11 DIAGNOSIS — Z23 Encounter for immunization: Secondary | ICD-10-CM | POA: Diagnosis not present

## 2020-08-11 MED ORDER — ATORVASTATIN CALCIUM 10 MG PO TABS: 10.0000 mg | ORAL_TABLET | Freq: Every day | ORAL | 3 refills | Status: DC

## 2020-08-11 NOTE — Patient Instructions (Signed)
Start lipitor at night.  Update me and stop the medicine if you have more aches.   Recheck in about 6 months at a physical.  Thanks for your effort.  Take care.  Glad to see you.

## 2020-08-11 NOTE — Progress Notes (Signed)
This visit occurred during the SARS-CoV-2 public health emergency.  Safety protocols were in place, including screening questions prior to the visit, additional usage of staff PPE, and extensive cleaning of exam room while observing appropriate contact time as indicated for disinfecting solutions.  Diabetes:  No meds.   Hypoglycemic episodes: no Hyperglycemic episodes:no Feet problems: no Blood Sugars averaging: 116 low- 160 max eye exam within last year: yes Labs d/w pt.  Statin indication d/w pt.  Routine cautions d/w pt.   A1c higher than prev.   Discussed with patient. He is doing really well with exercise and will work more on diet.    Flu shot done at office visit.  Meds, vitals, and allergies reviewed.  ROS: Per HPI unless specifically indicated in ROS section   GEN: nad, alert and oriented HEENT: ncat NECK: supple w/o LA CV: rrr. PULM: ctab, no inc wob ABD: soft, +bs EXT: no edema SKIN: no acute rash  Diabetic foot exam: Normal inspection No skin breakdown No calluses  Normal DP pulses Normal sensation to light touch and monofilament Nails normal

## 2020-08-14 NOTE — Assessment & Plan Note (Signed)
Continue work on diet and exercise.  A1c discussed with patient.  Discussed statin start.  Routine cautions given to patient.  Start Lipitor.  He will update me if he has any muscle aches.  We can recheck his labs later on.  See after visit summary.  He agrees.

## 2020-12-18 IMAGING — CT CT RENAL STONE PROTOCOL
2 of 4 series · 15 of 46 positions shown, 17 images · non-contrast
Comparison: 09/03/2007

CLINICAL DATA: 63-year-old with right flank pain. Kidney stone
history.

EXAM:
CT ABDOMEN AND PELVIS WITHOUT CONTRAST
TECHNIQUE: Multidetector CT imaging of the abdomen and pelvis was performed
following the standard protocol without IV contrast.

[Series 3: renal stone 5.0 · axial · 0.85mm/px · z∈[-908,-453]mm · 12 of 101 slices shown, 14 images]
[im 5/101  soft-tissue]
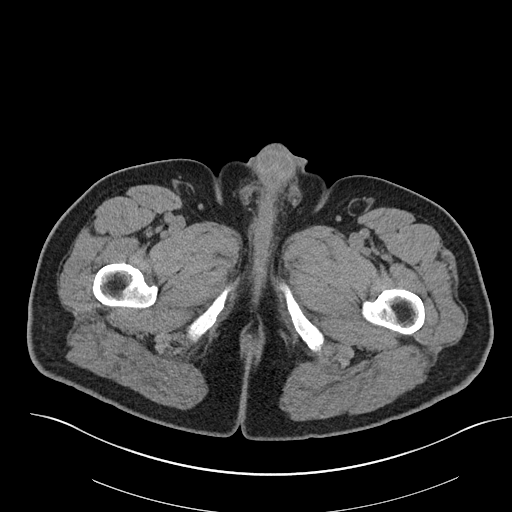
[im 5/101  bone]
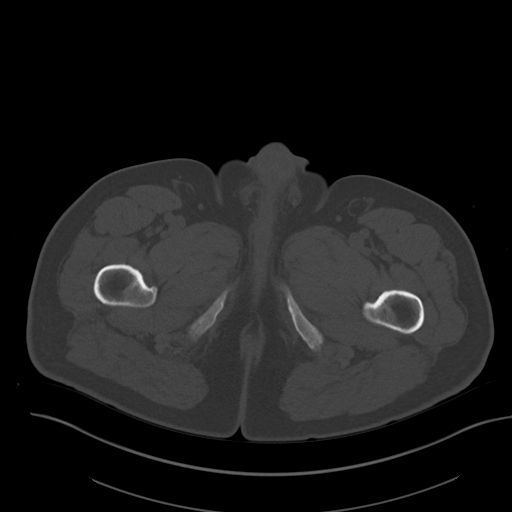
[im 13/101  soft-tissue]
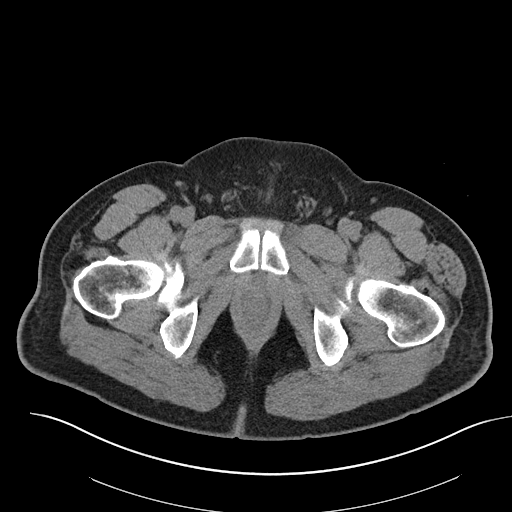
[im 21/101  soft-tissue]
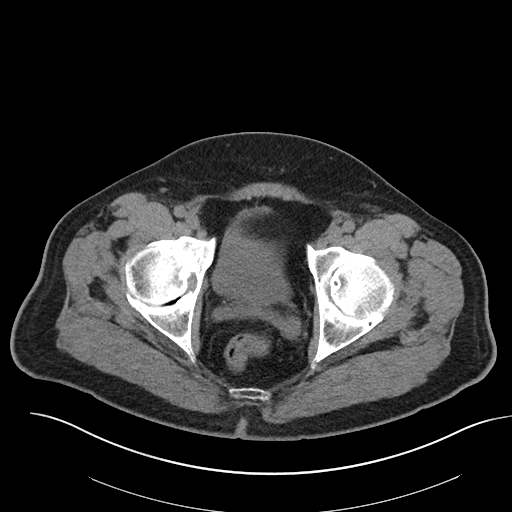
[im 30/101  soft-tissue]
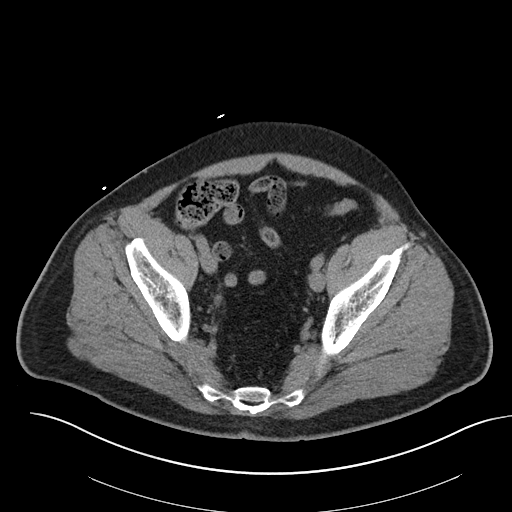
[im 38/101  soft-tissue]
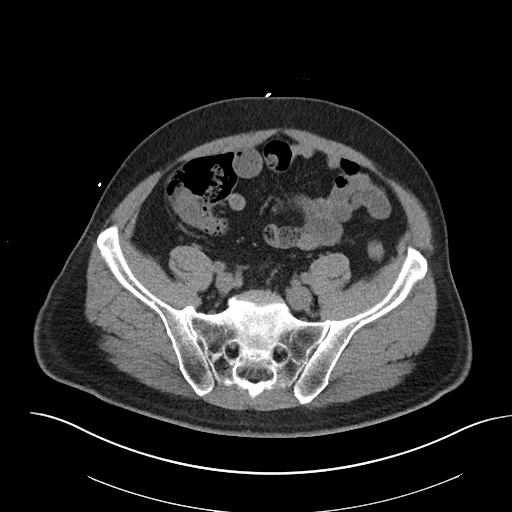
[im 46/101  soft-tissue]
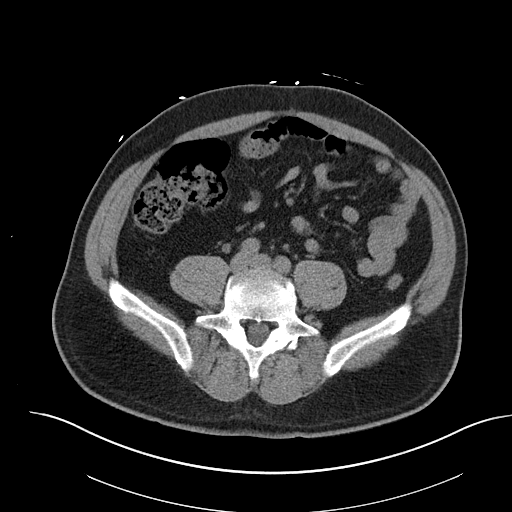
[im 55/101  soft-tissue]
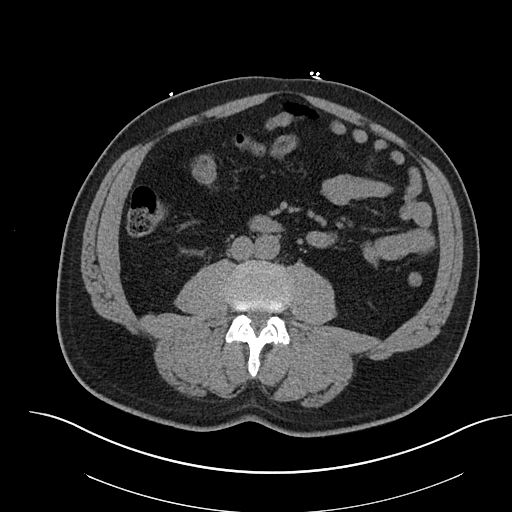
[im 63/101  soft-tissue]
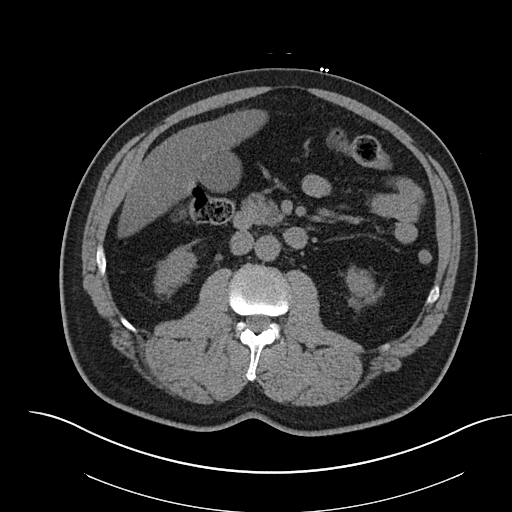
[im 71/101  soft-tissue]
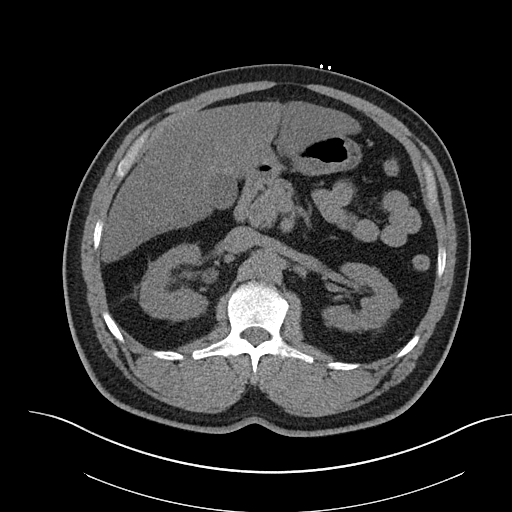
[im 71/101  bone]
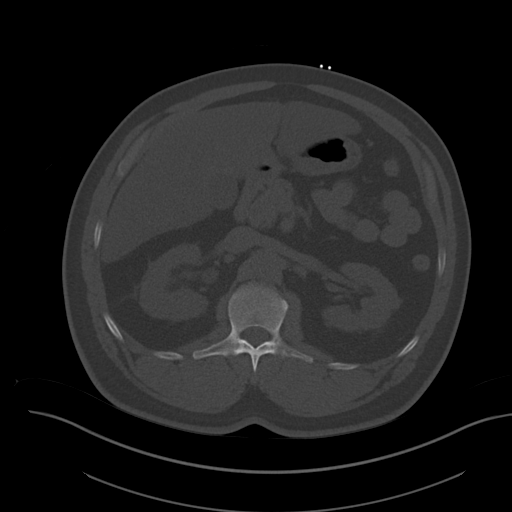
[im 80/101  soft-tissue]
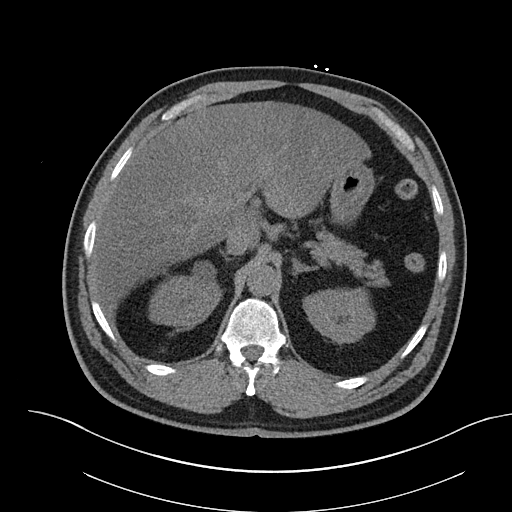
[im 88/101  soft-tissue]
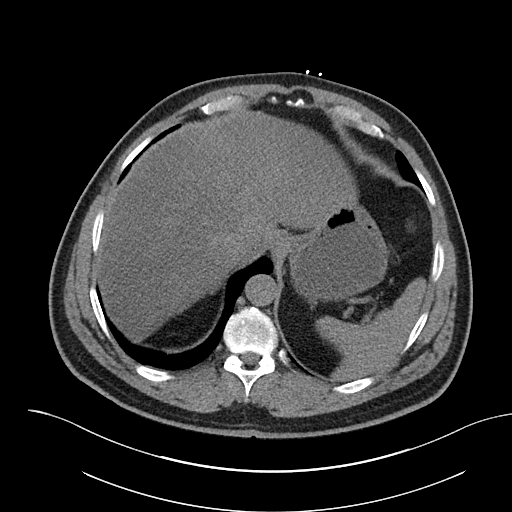
[im 96/101  soft-tissue]
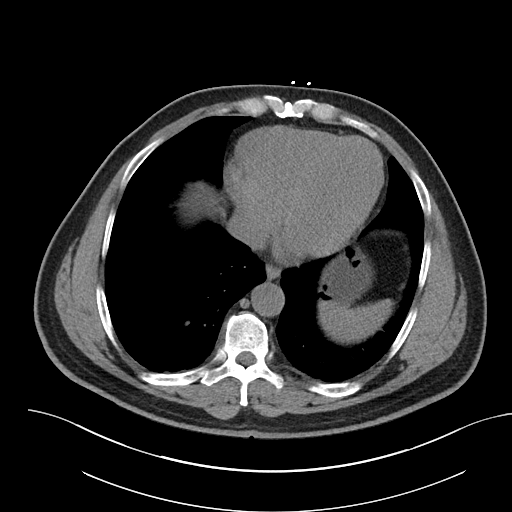

[Series 5: renal stone 3.0 cor · coronal · 0.77mm/px · 3 of 101 slices shown]
[im 34/101  soft-tissue]
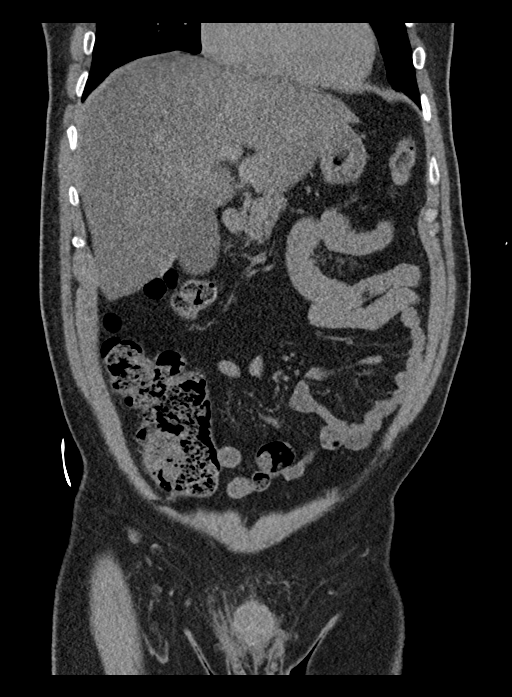
[im 45/101  soft-tissue]
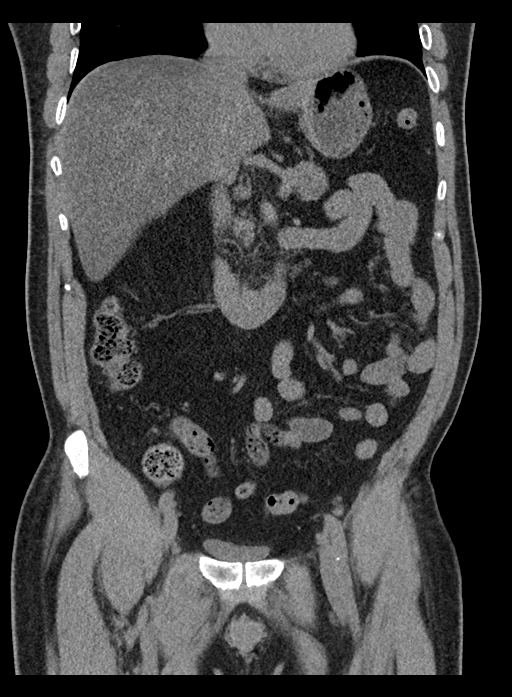
[im 56/101  soft-tissue]
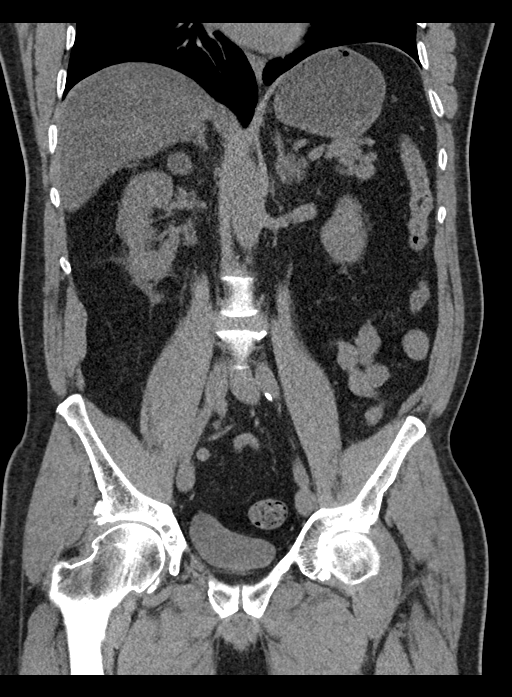

[15 of 46 positions shown; findings below may reference images not displayed]

FINDINGS: Lower chest: Lung bases are clear.

Hepatobiliary: Diffuse low-attenuation of the liver is compatible
with steatosis. Normal appearance of the gallbladder.

Pancreas: Unremarkable. No pancreatic ductal dilatation or
surrounding inflammatory changes.

Spleen: Normal in size without focal abnormality.

Adrenals/Urinary Tract: Probable small nodule in the left adrenal
gland with Hounsfield units measuring 10. Findings are compatible
with a small benign adenoma. Normal appearance of the right adrenal
gland. Normal appearance of the left kidney without left kidney
stones. Negative for left hydronephrosis. Urinary bladder is
decompressed. Exophytic hypodensity at the right kidney upper pole
is water attenuation and measures roughly 2.1 cm. This measured
roughly 1.5 cm in 8665. Findings are suggestive for a slightly
enlarged renal cyst. Mild right hydroureteronephrosis with a 4 mm
stone in the distal right ureter near the ureterovesical junction.
Mild perinephric stranding, right side greater than left. No stones
within the right kidney.

Stomach/Bowel: Stomach is within normal limits. Appendix appears
normal. No evidence of bowel wall thickening, distention, or
inflammatory changes.

Vascular/Lymphatic: Small amount of wall calcifications in the iliac
arteries. Negative for an aortic aneurysm. No abdominopelvic
lymphadenopathy.

Reproductive: Prostate is unremarkable.

Other: Negative for ascites.  Negative for free air.

Musculoskeletal: Joint space narrowing in both hips. Degenerative
facet arthropathy at L4-L5 and L5-S1.
IMPRESSION: 1. Mild right hydroureteronephrosis secondary to a 4 mm stone in the
distal right ureter near the right ureterovesical junction. No other
kidney or ureter stones.
2. Hepatic steatosis.
3. Probable right renal cyst.

## 2021-02-26 ENCOUNTER — Other Ambulatory Visit: Payer: Self-pay | Admitting: Family Medicine

## 2021-02-26 DIAGNOSIS — E119 Type 2 diabetes mellitus without complications: Secondary | ICD-10-CM

## 2021-02-26 DIAGNOSIS — Z125 Encounter for screening for malignant neoplasm of prostate: Secondary | ICD-10-CM

## 2021-03-01 ENCOUNTER — Ambulatory Visit (INDEPENDENT_AMBULATORY_CARE_PROVIDER_SITE_OTHER): Payer: PPO

## 2021-03-01 ENCOUNTER — Other Ambulatory Visit: Payer: Self-pay

## 2021-03-01 DIAGNOSIS — Z0000 Encounter for general adult medical examination without abnormal findings: Secondary | ICD-10-CM

## 2021-03-01 NOTE — Progress Notes (Signed)
PCP notes:  Health Maintenance: Prevnar 13- due Eye exam- due   Abnormal Screenings: none   Patient concerns: none   Nurse concerns: none   Next PCP appt.: 03/07/2021 @ 9 am

## 2021-03-01 NOTE — Progress Notes (Signed)
Subjective:   Philip Callahan is a 65 y.o. male who presents for Medicare Annual/Subsequent preventive examination.  Review of Systems: N/A      I connected with the patient today by telephone and verified that I am speaking with the correct person using two identifiers. Location patient: home Location nurse: work Persons participating in the telephone visit: patient, nurse.   I discussed the limitations, risks, security and privacy concerns of performing an evaluation and management service by telephone and the availability of in person appointments. I also discussed with the patient that there may be a patient responsible charge related to this service. The patient expressed understanding and verbally consented to this telephonic visit.        Cardiac Risk Factors include: advanced age (>27mn, >>39women);diabetes mellitus;Other (see comment), Risk factor comments: hyperlipidemia     Objective:    Today's Vitals   There is no height or weight on file to calculate BMI.  Advanced Directives 03/01/2021 02/16/2020 08/01/2019 08/01/2019 11/06/2017  Does Patient Have a Medical Advance Directive? No No No No No  Would patient like information on creating a medical advance directive? No - Patient declined No - Patient declined No - Patient declined No - Patient declined -    Current Medications (verified) Outpatient Encounter Medications as of 03/01/2021  Medication Sig  . atorvastatin (LIPITOR) 10 MG tablet Take 1 tablet (10 mg total) by mouth daily.  . Blood Glucose Monitoring Suppl (TRUE METRIX METER) w/Device KIT Use to test blood sugar once daily.  Dx:  E11.9  Non insulin dependent.  . Cholecalciferol 2000 units TABS Take by mouth daily as needed.  .Marland Kitchenglucose blood (TRUE METRIX BLOOD GLUCOSE TEST) test strip Use as instructed to test blood sugar once daily.  Dx:  E11.9  Non insulin dependent  . Lancets MISC True Metrix Lancets.  Use to test blood sugar once daily.  Dx:  E11.9  Non  insulin dependent.  . Multiple Vitamin (MULTIVITAMIN) tablet Take 1 tablet by mouth as needed.  .Marland KitchenoxyCODONE (ROXICODONE) 5 MG immediate release tablet Take 1 tablet (5 mg total) by mouth every 6 (six) hours as needed for severe pain (for kidney stones).   No facility-administered encounter medications on file as of 03/01/2021.    Allergies (verified) Patient has no known allergies.   History: Past Medical History:  Diagnosis Date  . Allergic rhinitis 03/2002  . BPH (benign prostatic hyperplasia) 03/2002  . [place]    prediabetic per patient - no meds, diet controlled  . Heart murmur    as child, no problems  . Hyperlipemia 1990's   diet controlled  . Kidney stones    passed stones   Past Surgical History:  Procedure Laterality Date  . AKN  of scalp  01/2001   Dermatology: Dr. [place] Crane . COLONOSCOPY  07/2007   Family History  Problem Relation Age of Onset  . Heart disease Mother        MI  . Hypertension Mother   . Diabetes Mother   . Hyperlipidemia Mother   . Stroke Father   . Hypertension Father   . Diabetes Father   . Anemia Father   . Prostate cancer Father   . Diabetes Sister   . Vitiligo Sister   . Diabetes Brother   . Cancer Brother        Skin  . Alcohol abuse Brother   . Heart disease Brother  Pacemaker  . Prostate cancer Brother   . Depression Neg Hx   . Colon cancer Neg Hx   . Colon polyps Neg Hx   . Stomach cancer Neg Hx   . Rectal cancer Neg Hx    Social History   Socioeconomic History  . Marital status: Married    Spouse name: Not on file  . Number of children: 2  . Years of education: Not on file  . Highest education level: Not on file  Occupational History  . Occupation: Administrator, sports: [employer]  Tobacco Use  . Smoking status: Never Smoker  . Smokeless tobacco: Never Used  Vaping Use  . Vaping Use: Never used  Substance and Sexual Activity  . Alcohol use: No     Alcohol/week: 0.0 standard drinks  . Drug use: No  . Sexual activity: Yes  Other Topics Concern  . Not on file  Social History Narrative   Lives with wife, married 1976   1 daughter local as of 2018 and 1 son in [place] (both have done mission work)   Retired from [employer], Librarian, academic.   [place] fan   Social Determinants of Health   Financial Resource Strain: Not on file  Food Insecurity: Not on file  Transportation Needs: Not on file  Physical Activity: Not on file  Stress: Not on file  Social Connections: Not on file    Tobacco Counseling Counseling given: Not Answered   Clinical Intake:  Pre-visit preparation completed: Yes  Pain : No/denies pain     Nutritional Risks: None Diabetes: Yes CBG done?: No Did pt. bring in CBG monitor from home?: No  How often do you need to have someone help you when you read instructions, pamphlets, or other written materials from your doctor or pharmacy?: 1 - Never What is the last grade level you completed in school?: college  Diabetic: Yes Nutrition Risk Assessment:  Has the patient had any N/V/D within the last 2 months?  No  Does the patient have any non-healing wounds?  No  Has the patient had any unintentional weight loss or weight gain?  No   Diabetes:  Is the patient diabetic?  Yes  If diabetic, was a CBG obtained today?  No  telephone visit Did the patient bring in their glucometer from home?  No  telephone visit  How often do you monitor your CBG's? Daily.   Financial Strains and Diabetes Management:  Are you having any financial strains with the device, your supplies or your medication? No .  Does the patient want to be seen by Chronic Care Management for management of their diabetes?  No  Would the patient like to be referred to a Nutritionist or for Diabetic Management?  No   Diabetic Exams:  Diabetic Eye Exam: Overdue for diabetic eye exam. Pt has been advised about the importance in  completing this exam. Patient advised to call and schedule an eye exam. Diabetic Foot Exam: Completed 08/11/2020   Interpreter Needed?: No  Information entered by :: Struthers, LPN   Activities of Daily Living In your present state of health, do you have any difficulty performing the following activities: 03/01/2021  Hearing? N  Vision? N  Difficulty concentrating or making decisions? N  Walking or climbing stairs? N  Dressing or bathing? N  Doing errands, shopping? N  Preparing Food and eating ? N  Using the Toilet? N  In the past six months, have you accidently leaked  urine? Y  Do you have problems with loss of bowel control? N  Managing your Medications? N  Managing your Finances? N  Housekeeping or managing your Housekeeping? N  Some recent data might be hidden    Patient Care Team: Tonia Ghent, MD as PCP - General (Family Medicine)  Indicate any recent Medical Services you may have received from other than [facility] providers in the past year (date may be approximate).     Assessment:   This is a routine wellness examination for Phi.  Hearing/Vision screen  Hearing Screening   '125Hz'  '250Hz'  '500Hz'  '1000Hz'  '2000Hz'  '3000Hz'  '4000Hz'  '6000Hz'  '8000Hz'   Right ear:           Left ear:           Vision Screening Comments: Patient gets annual eye exams   Dietary issues and exercise activities discussed: Current Exercise Habits: Structured exercise class, Type of exercise: Other - see comments (swimming), Time (Minutes): 60, Frequency (Times/Week): 6, Weekly Exercise (Minutes/Week): 360, Intensity: Moderate, Exercise limited by: None identified  Goals    . Patient Stated     02/16/2020, I will continue to swim 6 days a week for 45 minutes.     . Patient Stated     03/01/2021, I will continue swimming 6 days a week for 1 hour.      Depression Screen PHQ 2/9 Scores 03/01/2021 02/16/2020 08/15/2017  PHQ - 2 Score 0 0 0  PHQ- 9 Score 0 0 -    Fall Risk Fall Risk  03/01/2021 02/16/2020  06/25/2019  Falls in the past year? 0 0 0  Comment - - Emmi Telephone Survey: data to providers prior to load  Number falls in past yr: 0 0 -  Injury with Fall? 0 0 -  Risk for fall due to : Medication side effect No Fall Risks -  Follow up Falls evaluation completed;Falls prevention discussed Falls evaluation completed;Falls prevention discussed -    FALL RISK PREVENTION PERTAINING TO THE HOME:  Any stairs in or around the home? Yes  If so, are there any without handrails? No  Home free of loose throw rugs in walkways, pet beds, electrical cords, etc? Yes  Adequate lighting in your home to reduce risk of falls? Yes   ASSISTIVE DEVICES UTILIZED TO PREVENT FALLS:  Life alert? No  Use of a cane, walker or w/c? No  Grab bars in the bathroom? No  Shower chair or bench in shower? No  Elevated toilet seat or a handicapped toilet? No   TIMED UP AND GO:  Was the test performed? N/A telephone visit .  Cognitive Function: MMSE - Mini Mental State Exam 03/01/2021 02/16/2020  Orientation to time 5 5  Orientation to Place 5 5  Registration 3 3  Attention/ Calculation 5 5  Recall 3 3  Language- repeat 1 1  Mini Cog  Mini-Cog screen was completed. Maximum score is 22. A value of 0 denotes this part of the MMSE was not completed or the patient failed this part of the Mini-Cog screening.       Immunizations Immunization History  Administered Date(s) Administered  . Fluad Quad(high Dose 65+) 10/30/2019, 08/11/2020  . Influenza Split 10/11/2011, 10/14/2012  . Influenza Whole 09/23/2008, 09/26/2009, 10/05/2010  . Influenza,inj,Quad PF,6+ Mos 11/03/2014, 08/15/2017  . PFIZER(Purple Top)SARS-COV-2 Vaccination 01/11/2020, 02/01/2020  . Td 03/31/2004  . Tdap 11/03/2014    TDAP status: Up to date  Flu Vaccine status: Up to date  Pneumococcal vaccine status:  Due, Education has been provided regarding the importance of this vaccine. Advised may receive this vaccine at local pharmacy or  Health Dept. Aware to provide a copy of the vaccination record if obtained from local pharmacy or Health Dept. Verbalized acceptance and understanding.  Covid-19 vaccine status: Completed vaccines  Qualifies for Shingles Vaccine? Yes   Zostavax completed No   Shingrix Completed?: No.    Education has been provided regarding the importance of this vaccine. Patient has been advised to call insurance company to determine out of pocket expense if they have not yet received this vaccine. Advised may also receive vaccine at local pharmacy or Health Dept. Verbalized acceptance and understanding.  Screening Tests Health Maintenance  Topic Date Due  . PNA vac Low Risk Adult (1 of 2 - PCV13) Never done  . COVID-19 Vaccine (3 - Booster for Pfizer series) 08/03/2020  . OPHTHALMOLOGY EXAM  01/18/2021  . HEMOGLOBIN A1C  02/05/2021  . URINE MICROALBUMIN  02/15/2021  . INFLUENZA VACCINE  06/26/2021  . FOOT EXAM  08/11/2021  . COLONOSCOPY (Pts 45-85yr Insurance coverage will need to be confirmed)  11/06/2022  . TETANUS/TDAP  11/03/2024  . Hepatitis C Screening  Completed  . HPV VACCINES  Aged Out    Health Maintenance  Health Maintenance Due  Topic Date Due  . PNA vac Low Risk Adult (1 of 2 - PCV13) Never done  . COVID-19 Vaccine (3 - Booster for Pfizer series) 08/03/2020  . OPHTHALMOLOGY EXAM  01/18/2021  . HEMOGLOBIN A1C  02/05/2021  . URINE MICROALBUMIN  02/15/2021    Colorectal cancer screening: Type of screening: Colonoscopy. Completed 11/06/2017. Repeat every 5 years  Lung Cancer Screening: (Low Dose CT Chest recommended if Age 67-80years, 30 pack-year currently smoking OR have quit w/in 15 years.) does not qualify.   Additional Screening:  Hepatitis C Screening: does qualify; Completed 08/13/2017  Vision Screening: Recommended annual ophthalmology exams for early detection of glaucoma and other disorders of the eye. Is the patient up to date with their annual eye exam?  No  Who is  the provider or what is the name of the office in which the patient attends annual eye exams? Can't remember name of office right now If pt is not established with a provider, would they like to be referred to a provider to establish care? No .   Dental Screening: Recommended annual dental exams for proper oral hygiene  Community Resource Referral / Chronic Care Management: CRR required this visit?  No   CCM required this visit?  No      Plan:     I have personally reviewed and noted the following in the patient's chart:   . Medical and social history . Use of alcohol, tobacco or illicit drugs  . Current medications and supplements . Functional ability and status . Nutritional status . Physical activity . Advanced directives . List of other physicians . Hospitalizations, surgeries, and ER visits in previous 12 months . Vitals . Screenings to include cognitive, depression, and falls . Referrals and appointments  In addition, I have reviewed and discussed with patient certain preventive protocols, quality metrics, and best practice recommendations. A written personalized care plan for preventive services as well as general preventive health recommendations were provided to patient.   Due to this being a telephonic visit, the after visit summary with patients personalized plan was offered to patient via office or my-chart. Patient preferred to pick up at office at next visit or via  mychart.   Andrez Grime, LPN   01/31/3290

## 2021-03-01 NOTE — Patient Instructions (Signed)
[name] , Thank you for taking time to come for your Medicare Wellness Visit. I appreciate your ongoing commitment to your health goals. Please review the following plan we discussed and let me know if I can assist you in the future.   Screening recommendations/referrals: Colonoscopy: Up to date, completed 11/06/2017, due 10/2022 Recommended yearly ophthalmology/optometry visit for glaucoma screening and checkup Recommended yearly dental visit for hygiene and checkup  Vaccinations: Influenza vaccine: Up to date, completed 08/11/2020, due 06/2021 Pneumococcal vaccine: due, will get at physical Tdap vaccine: Up to date, completed 11/03/2014, due 10/2024 Shingles vaccine: due, check with your insurance regarding coverage if interested    Covid-19: Completed series Please bring date for booster to physical so we can document in chart  Advanced directives: Advance directive discussed with you today. Even though you declined this today please call our office should you change your mind and we can give you the proper paperwork for you to fill out.   Conditions/risks identified: diabetes, hyperlipidemia   Next appointment: Follow up in one year for your annual wellness visit.   Preventive Care 76 Years and Older, Male Preventive care refers to lifestyle choices and visits with your health care provider that can promote health and wellness. What does preventive care include?  A yearly physical exam. This is also called an annual well check.  Dental exams once or twice a year.  Routine eye exams. Ask your health care provider how often you should have your eyes checked.  Personal lifestyle choices, including:  Daily care of your teeth and gums.  Regular physical activity.  Eating a healthy diet.  Avoiding tobacco and drug use.  Limiting alcohol use.  Practicing safe sex.  Taking low doses of aspirin every day.  Taking vitamin and mineral supplements as recommended by your  health care provider. What happens during an annual well check? The services and screenings done by your health care provider during your annual well check will depend on your age, overall health, lifestyle risk factors, and family history of disease. Counseling  Your health care provider may ask you questions about your:  Alcohol use.  Tobacco use.  Drug use.  Emotional well-being.  Home and relationship well-being.  Sexual activity.  Eating habits.  History of falls.  Memory and ability to understand (cognition).  Work and work Statistician. Screening  You may have the following tests or measurements:  Height, weight, and BMI.  Blood pressure.  Lipid and cholesterol levels. These may be checked every 5 years, or more frequently if you are over 90 years old.  Skin check.  Lung cancer screening. You may have this screening every year starting at age 25 if you have a 30-pack-year history of smoking and currently smoke or have quit within the past 15 years.  Fecal occult blood test (FOBT) of the stool. You may have this test every year starting at age 19.  Flexible sigmoidoscopy or colonoscopy. You may have a sigmoidoscopy every 5 years or a colonoscopy every 10 years starting at age 49.  Prostate cancer screening. Recommendations will vary depending on your family history and other risks.  Hepatitis C blood test.  Hepatitis B blood test.  Sexually transmitted disease (STD) testing.  Diabetes screening. This is done by checking your blood sugar (glucose) after you have not eaten for a while (fasting). You may have this done every 1-3 years.  Abdominal aortic aneurysm (AAA) screening. You may need this if you are a current or former  smoker.  Osteoporosis. You may be screened starting at age 72 if you are at high risk. Talk with your health care provider about your test results, treatment options, and if necessary, the need for more tests. Vaccines  Your health  care provider may recommend certain vaccines, such as:  Influenza vaccine. This is recommended every year.  Tetanus, diphtheria, and acellular pertussis (Tdap, Td) vaccine. You may need a Td booster every 10 years.  Zoster vaccine. You may need this after age 39.  Pneumococcal 13-valent conjugate (PCV13) vaccine. One dose is recommended after age 38.  Pneumococcal polysaccharide (PPSV23) vaccine. One dose is recommended after age 50. Talk to your health care provider about which screenings and vaccines you need and how often you need them. This information is not intended to replace advice given to you by your health care provider. Make sure you discuss any questions you have with your health care provider. Document Released: 12/09/2015 Document Revised: 08/01/2016 Document Reviewed: 09/13/2015 Elsevier Interactive Patient Education  2017 [place] Prevention in the Home Falls can cause injuries. They can happen to people of all ages. There are many things you can do to make your home safe and to help prevent falls. What can I do on the outside of my home?  Regularly fix the edges of walkways and driveways and fix any cracks.  Remove anything that might make you trip as you walk through a door, such as a raised step or threshold.  Trim any bushes or trees on the path to your home.  Use bright outdoor lighting.  Clear any walking paths of anything that might make someone trip, such as rocks or tools.  Regularly check to see if handrails are loose or broken. Make sure that both sides of any steps have handrails.  Any raised decks and porches should have guardrails on the edges.  Have any leaves, snow, or ice cleared regularly.  Use sand or salt on walking paths during winter.  Clean up any spills in your garage right away. This includes oil or grease spills. What can I do in the bathroom?  Use night lights.  Install grab bars by the toilet and in the tub and  shower. Do not use towel bars as grab bars.  Use non-skid mats or decals in the tub or shower.  If you need to sit down in the shower, use a plastic, non-slip stool.  Keep the floor dry. Clean up any water that spills on the floor as soon as it happens.  Remove soap buildup in the tub or shower regularly.  Attach bath mats securely with double-sided non-slip rug tape.  Do not have throw rugs and other things on the floor that can make you trip. What can I do in the bedroom?  Use night lights.  Make sure that you have a light by your bed that is easy to reach.  Do not use any sheets or blankets that are too big for your bed. They should not hang down onto the floor.  Have a firm chair that has side arms. You can use this for support while you get dressed.  Do not have throw rugs and other things on the floor that can make you trip. What can I do in the kitchen?  Clean up any spills right away.  Avoid walking on wet floors.  Keep items that you use a lot in easy-to-reach places.  If you need to reach something above you, use a  strong step stool that has a grab bar.  Keep electrical cords out of the way.  Do not use floor polish or wax that makes floors slippery. If you must use wax, use non-skid floor wax.  Do not have throw rugs and other things on the floor that can make you trip. What can I do with my stairs?  Do not leave any items on the stairs.  Make sure that there are handrails on both sides of the stairs and use them. Fix handrails that are broken or loose. Make sure that handrails are as long as the stairways.  Check any carpeting to make sure that it is firmly attached to the stairs. Fix any carpet that is loose or worn.  Avoid having throw rugs at the top or bottom of the stairs. If you do have throw rugs, attach them to the floor with carpet tape.  Make sure that you have a light switch at the top of the stairs and the bottom of the stairs. If you do not  have them, ask someone to add them for you. What else can I do to help prevent falls?  Wear shoes that:  Do not have high heels.  Have rubber bottoms.  Are comfortable and fit you well.  Are closed at the toe. Do not wear sandals.  If you use a stepladder:  Make sure that it is fully opened. Do not climb a closed stepladder.  Make sure that both sides of the stepladder are locked into place.  Ask someone to hold it for you, if possible.  Clearly mark and make sure that you can see:  Any grab bars or handrails.  First and last steps.  Where the edge of each step is.  Use tools that help you move around (mobility aids) if they are needed. These include:  Canes.  Walkers.  Scooters.  Crutches.  Turn on the lights when you go into a dark area. Replace any light bulbs as soon as they burn out.  Set up your furniture so you have a clear path. Avoid moving your furniture around.  If any of your floors are uneven, fix them.  If there are any pets around you, be aware of where they are.  Review your medicines with your doctor. Some medicines can make you feel dizzy. This can increase your chance of falling. Ask your doctor what other things that you can do to help prevent falls. This information is not intended to replace advice given to you by your health care provider. Make sure you discuss any questions you have with your health care provider. Document Released: 09/08/2009 Document Revised: 04/19/2016 Document Reviewed: 12/17/2014 Elsevier Interactive Patient Education  2017 [employer].

## 2021-03-02 ENCOUNTER — Other Ambulatory Visit: Payer: Self-pay

## 2021-03-02 ENCOUNTER — Other Ambulatory Visit (INDEPENDENT_AMBULATORY_CARE_PROVIDER_SITE_OTHER): Payer: PPO

## 2021-03-02 DIAGNOSIS — E119 Type 2 diabetes mellitus without complications: Secondary | ICD-10-CM

## 2021-03-02 DIAGNOSIS — Z125 Encounter for screening for malignant neoplasm of prostate: Secondary | ICD-10-CM

## 2021-03-02 LAB — LIPID PANEL
Cholesterol: 184 mg/dL (ref 0–200)
HDL: 41.9 mg/dL (ref >39.00)
NonHDL: 141.78
Total CHOL/HDL Ratio: 4
Triglycerides: 235 mg/dL — ABNORMAL HIGH (ref 0.0–149.0)
VLDL: 47 mg/dL — ABNORMAL HIGH (ref 0.0–40.0)

## 2021-03-02 LAB — COMPREHENSIVE METABOLIC PANEL
ALT: 23 U/L (ref 0–53)
AST: 21 U/L (ref 0–37)
Albumin: 4.2 g/dL (ref 3.5–5.2)
Alkaline Phosphatase: 86 U/L (ref 39–117)
BUN: 13 mg/dL (ref 6–23)
CO2: 29 mEq/L (ref 19–32)
Calcium: 9.6 mg/dL (ref 8.4–10.5)
Chloride: 103 mEq/L (ref 96–112)
Creatinine, Ser: 1.17 mg/dL (ref 0.40–1.50)
GFR: 64.72 mL/min (ref >60.00)
Glucose, Bld: 192 mg/dL — ABNORMAL HIGH (ref 70–99)
Potassium: 4.4 mEq/L (ref 3.5–5.1)
Sodium: 139 mEq/L (ref 135–145)
Total Bilirubin: 0.6 mg/dL (ref 0.2–1.2)
Total Protein: 7.4 g/dL (ref 6.0–8.3)

## 2021-03-02 LAB — PSA, MEDICARE: PSA: 0.49 ng/ml (ref 0.10–4.00)

## 2021-03-02 LAB — HEMOGLOBIN A1C: Hgb A1c MFr Bld: 8.4 % — ABNORMAL HIGH (ref 4.6–6.5)

## 2021-03-02 LAB — LDL CHOLESTEROL, DIRECT: Direct LDL: 98 mg/dL

## 2021-03-07 ENCOUNTER — Other Ambulatory Visit: Payer: Self-pay

## 2021-03-07 ENCOUNTER — Encounter: Payer: Self-pay | Admitting: Family Medicine

## 2021-03-07 ENCOUNTER — Ambulatory Visit (INDEPENDENT_AMBULATORY_CARE_PROVIDER_SITE_OTHER): Payer: PPO | Admitting: Family Medicine

## 2021-03-07 VITALS — BP 126/70 | HR 80 | Temp 97.2°F | Ht 72.0 in | Wt 229.0 lb

## 2021-03-07 DIAGNOSIS — Z7189 Other specified counseling: Secondary | ICD-10-CM

## 2021-03-07 DIAGNOSIS — E119 Type 2 diabetes mellitus without complications: Secondary | ICD-10-CM

## 2021-03-07 DIAGNOSIS — E785 Hyperlipidemia, unspecified: Secondary | ICD-10-CM | POA: Diagnosis not present

## 2021-03-07 DIAGNOSIS — Z0000 Encounter for general adult medical examination without abnormal findings: Secondary | ICD-10-CM

## 2021-03-07 NOTE — Progress Notes (Signed)
This visit occurred during the SARS-CoV-2 public health emergency.  Safety protocols were in place, including screening questions prior to the visit, additional usage of staff PPE, and extensive cleaning of exam room while observing appropriate contact time as indicated for disinfecting solutions.  His son is moving from [place] to [place], d/w pt.    H/o renal stones, no recent events.   Elevated Cholesterol: Using medications without problems: yes Muscle aches: no Diet compliance: yes Exercise: yes  Diabetes:  No meds.  Hypoglycemic episodes: only if prolonged fasting and cautions d/w pt.   Hyperglycemic episodes: no Feet problems:  no Blood Sugars averaging: episodic checks, ~150 eye exam within last year: due, d/w pt.   Swimming for exercise.    tdap 2015 covid 2021 Flu shot 2021 Shingles d/w pt.   PNA d/w pt. Would await PNA 20.   PSA wnl.   Colonoscopy 2018 Diet and exercise discussed with patient Living will d/w pt. Would have wife designated if patient were incapacitated.   PMH and SH reviewed  Meds, vitals, and allergies reviewed.   ROS: Per HPI unless specifically indicated in ROS section   GEN: nad, alert and oriented HEENT: ncat NECK: supple w/o LA CV: rrr. PULM: ctab, no inc wob ABD: soft, +bs EXT: no edema SKIN: no acute rash  Diabetic foot exam: Normal inspection No skin breakdown No calluses  Normal DP pulses Normal sensation to light touch and monofilament Nails normal

## 2021-03-07 NOTE — Patient Instructions (Addendum)
Thanks for your effort.  Keep working on diet and recheck in about 3 months.  A1c at the visit.  Take care.  Glad to see you. Update me as needed.

## 2021-03-08 NOTE — Assessment & Plan Note (Signed)
Continue atorvastatin.  Labs discussed with patient.  Continue work on diet and exercise.

## 2021-03-08 NOTE — Assessment & Plan Note (Signed)
  tdap 2015 covid 2021 Flu shot 2021 Shingles d/w pt.   PNA d/w pt. Would await PNA 20.   PSA wnl.   Colonoscopy 2018 Diet and exercise discussed with patient Living will d/w pt. Would have wife designated if patient were incapacitated.

## 2021-03-08 NOTE — Assessment & Plan Note (Signed)
Living will d/w pt. Would have wife designated if patient were incapacitated.  

## 2021-03-08 NOTE — Assessment & Plan Note (Signed)
No meds currently.  A1c discussed with patient.  Would hold off on starting medication at this point.  He is exercising most days of the week with swimming.  He will work more on diet and still check his sugar episodically at home.  If sugars not improving at all he will let me know.  If it is improving then we can plan on recheck in a few months.  See after visit summary.  He agrees with plan.

## 2021-06-06 ENCOUNTER — Other Ambulatory Visit: Payer: Self-pay

## 2021-06-06 ENCOUNTER — Ambulatory Visit: Payer: PPO | Admitting: Family Medicine

## 2021-06-06 ENCOUNTER — Ambulatory Visit (INDEPENDENT_AMBULATORY_CARE_PROVIDER_SITE_OTHER): Payer: PPO | Admitting: Family Medicine

## 2021-06-06 ENCOUNTER — Encounter: Payer: Self-pay | Admitting: Family Medicine

## 2021-06-06 VITALS — BP 146/80 | HR 84 | Temp 97.5°F | Wt 232.6 lb

## 2021-06-06 DIAGNOSIS — E119 Type 2 diabetes mellitus without complications: Secondary | ICD-10-CM

## 2021-06-06 LAB — POCT GLYCOSYLATED HEMOGLOBIN (HGB A1C): Hemoglobin A1C: 8.3 % — AB (ref 4.0–5.6)

## 2021-06-06 MED ORDER — METFORMIN HCL 500 MG PO TABS: 500.0000 mg | ORAL_TABLET | Freq: Every day | ORAL | 3 refills | Status: DC

## 2021-06-06 NOTE — Progress Notes (Signed)
This visit occurred during the SARS-CoV-2 public health emergency.  Safety protocols were in place, including screening questions prior to the visit, additional usage of staff PPE, and extensive cleaning of exam room while observing appropriate contact time as indicated for disinfecting solutions.  Diabetes:  No meds.   Hypoglycemic episodes: no Hyperglycemic episodes: no Feet problems: no Blood Sugars averaging: 125-150s eye exam within last year: d/w pt about f/u this year.   Diet and exercise d/w pt.  [facility]  He has been walking and swimming.   A1c 8.3.   D/w pt about diet, DM2 referral for ed, and metformin with routine cautions d/w pt. detailed conversation about rationale for metformin use and routine cautions.  Meds, vitals, and allergies reviewed.   ROS: Per HPI unless specifically indicated in ROS section   GEN: nad, alert and oriented HEENT:ncat NECK: supple w/o LA CV: rrr. PULM: ctab, no inc wob ABD: soft, +bs EXT: no edema SKIN: no acute rash  Diabetic foot exam: Normal inspection No skin breakdown No calluses  Normal DP pulses Normal sensation to light touch and monofilament Nails normal  30 minutes were devoted to patient care in this encounter (this includes time spent reviewing the patient's file/history, interviewing and examining the patient, counseling/reviewing plan with patient).

## 2021-06-06 NOTE — Patient Instructions (Signed)
Plan on recheck in about 3 months.  Labs at the visit.  We'll call about DM class.  Use the eat right diet.  Add on metformin once a day with breakfast.  Update me as needed.  Take care.  Glad to see you.

## 2021-06-07 NOTE — Assessment & Plan Note (Signed)
Diet and exercise d/w pt.  [place]  He has been walking and swimming.   A1c 8.3.   D/w pt about diet, DM2 referral for ed, and metformin with routine cautions d/w pt. Given that he is already been exercising frequently, he will try to improve his diet.  He is reasonable to start metformin.  We can recheck periodically.  See after visit summary.

## 2021-07-04 ENCOUNTER — Encounter: Payer: Self-pay | Admitting: *Deleted

## 2021-07-04 ENCOUNTER — Other Ambulatory Visit: Payer: Self-pay

## 2021-07-04 ENCOUNTER — Encounter: Payer: PPO | Attending: Family Medicine | Admitting: *Deleted

## 2021-07-04 VITALS — BP 130/76 | Ht 73.0 in | Wt 224.3 lb

## 2021-07-04 DIAGNOSIS — E119 Type 2 diabetes mellitus without complications: Secondary | ICD-10-CM | POA: Insufficient documentation

## 2021-07-04 NOTE — Progress Notes (Signed)
Diabetes Self-Management Education  Visit Type: First/Initial  Appt. Start Time: 0845 Appt. End Time: 0940  07/04/2021  [name], identified by name and date of birth, is a 65 y.o. male with a diagnosis of Diabetes: Type 2.   ASSESSMENT  Blood pressure 130/76, height '6\' 1"'$  (1.854 m), weight 224 lb 4.8 oz (101.7 kg). Body mass index is 29.59 kg/m.   Diabetes Self-Management Education - 07/04/21 1023       Visit Information   Visit Type First/Initial      Initial Visit   Diabetes Type Type 2    Are you currently following a meal plan? No    Are you taking your medications as prescribed? Yes    Date Diagnosed 2 years ago      Health Coping   How would you rate your overall health? Good      Psychosocial Assessment   Patient Belief/Attitude about Diabetes Other (comment)   "disappointed"   Self-care barriers None    Self-management support Doctor's office    Patient Concerns Nutrition/Meal planning;Glycemic Control;Medication;Monitoring;Weight Control;Healthy Lifestyle    Special Needs None    Preferred Learning Style Auditory;Visual;Hands on    Learning Readiness Ready    How often do you need to have someone help you when you read instructions, pamphlets, or other written materials from your doctor or pharmacy? 1 - Never    What is the last grade level you completed in school? college      Pre-Education Assessment   Patient understands the diabetes disease and treatment process. Needs Instruction    Patient understands incorporating nutritional management into lifestyle. Needs Instruction    Patient undertands incorporating physical activity into lifestyle. Demonstrates understanding / competency    Patient understands using medications safely. Needs Instruction    Patient understands monitoring blood glucose, interpreting and using results Needs Review    Patient understands prevention, detection, and treatment of acute complications. Needs Instruction     Patient understands prevention, detection, and treatment of chronic complications. Needs Instruction    Patient understands how to develop strategies to address psychosocial issues. Needs Instruction    Patient understands how to develop strategies to promote health/change behavior. Needs Instruction      Complications   Last HgB A1C per patient/outside source 8.3 %   06/06/2021   How often do you check your blood sugar? 0 times/day (not testing)   He reports last blood sugar was 125 mg/dL fasting - 3 weeks ago.   Have you had a dilated eye exam in the past 12 months? No    Have you had a dental exam in the past 12 months? Yes    Are you checking your feet? Yes    How many days per week are you checking your feet? 7      Dietary Intake   Breakfast palin oatmeal with 2 strips bacon and 1 piece of toast; eggs, sausage, toast and jelly    Snack (morning) he reports 1-2 snacks/day that consists of sweets    Lunch [place] or ham sandwich; chips    Dinner fish, chicken, occasional beef and pork; potatoes, occasional rice, pasta, peas, corn, beans, green beans, broccoli, cuccumbers, okra, greens    Beverage(s) water, juice, soda, milk      Exercise   Exercise Type Moderate (swimming / aerobic walking)    How many days per week to you exercise? 5    How many minutes per day do you exercise? [place]  Total minutes per week of exercise 450      Patient Education   Previous Diabetes Education No    Disease state  Definition of diabetes, type 1 and 2, and the diagnosis of diabetes;Factors that contribute to the development of diabetes    Nutrition management  Role of diet in the treatment of diabetes and the relationship between the three main macronutrients and blood glucose level;Food label reading, portion sizes and measuring food.;Carbohydrate counting;Reviewed blood glucose goals for pre and post meals and how to evaluate the patients' food intake on their blood glucose level.    Physical activity  and exercise  Role of exercise on diabetes management, blood pressure control and cardiac health.    Medications Reviewed patients medication for diabetes, action, purpose, timing of dose and side effects.    Monitoring Purpose and frequency of SMBG.;Taught/discussed recording of test results and interpretation of SMBG.;Identified appropriate SMBG and/or A1C goals.    Chronic complications Relationship between chronic complications and blood glucose control    Psychosocial adjustment Identified and addressed patients feelings and concerns about diabetes      Individualized Goals (developed by patient)   Reducing Risk Other (comment)   improve blood sugars, decrease medications, prevent diabetes complications, lose weight, lead a healthier lifestyle, become more fit     Outcomes   Expected Outcomes Demonstrated interest in learning. Expect positive outcomes    Future DMSE 4-6 wks             Individualized Plan for Diabetes Self-Management Training:   Learning Objective:  Patient will have a greater understanding of diabetes self-management. Patient education plan is to attend individual and/or group sessions per assessed needs and concerns.   Plan:   Patient Instructions  Check blood sugars before breakfast and 2 hours after one meal - 3 x week Bring blood sugar records to the next appointment  Exercise:  Continue swimming for    90  minutes   5  days a week  Eat 3 meals day,   1-2  snacks a day Space meals 4-6 hours apart Limit desserts/sweets Avoid sugar sweetened drinks (soda,  juices) Complete 3 Day Food Record and bring to next appt  Return for appointment/classes on:  Friday August 18, 2021 at 8:15 am with [facility] (dietitian)   Expected Outcomes:  Demonstrated interest in learning. Expect positive outcomes  Education material provided:  General Meal Planning Guidelines Simple Meal Plan 3 Day Food Record  If problems or questions, patient to contact team via:    Johny Drilling, RN, [place] [phone]  Future DSME appointment: 4-6 wks August 18, 2021 with the dietitian

## 2021-07-04 NOTE — Patient Instructions (Signed)
Check blood sugars before breakfast and 2 hours after one meal - 3 x week Bring blood sugar records to the next appointment  Exercise:  Continue swimming for    90  minutes   5  days a week  Eat 3 meals day,   1-2  snacks a day Space meals 4-6 hours apart Limit desserts/sweets Avoid sugar sweetened drinks (soda,  juices) Complete 3 Day Food Record and bring to next appt  Return for appointment/classes on:  Friday August 18, 2021 at 8:15 am with [facility] (dietitian)

## 2021-07-05 LAB — HM DIABETES EYE EXAM

## 2021-08-15 ENCOUNTER — Telehealth (INDEPENDENT_AMBULATORY_CARE_PROVIDER_SITE_OTHER): Payer: PPO | Admitting: Primary Care

## 2021-08-15 ENCOUNTER — Other Ambulatory Visit: Payer: Self-pay

## 2021-08-15 ENCOUNTER — Encounter: Payer: Self-pay | Admitting: Primary Care

## 2021-08-15 VITALS — Temp 97.8°F | Ht 73.0 in | Wt 224.0 lb

## 2021-08-15 DIAGNOSIS — U071 COVID-19: Secondary | ICD-10-CM | POA: Insufficient documentation

## 2021-08-15 MED ORDER — NIRMATRELVIR/RITONAVIR (PAXLOVID)TABLET: 3.0000 | ORAL_TABLET | Freq: Two times a day (BID) | ORAL | 0 refills | Status: AC

## 2021-08-15 NOTE — Progress Notes (Signed)
Patient ID: Philip Callahan, male    DOB: 1956/04/14, 65 y.o.   MRN: [id number]  Virtual visit completed through [place], a video enabled telemedicine application. Due to national recommendations of social distancing due to COVID-19, a virtual visit is felt to be most appropriate for this patient at this time. Reviewed limitations, risks, security and privacy concerns of performing a virtual visit and the availability of in person appointments. I also reviewed that there may be a patient responsible charge related to this service. The patient agreed to proceed.   Patient location: home Provider location: [place] [facility], office Persons participating in this virtual visit: patient, provider   If any vitals were documented, they were collected by patient at home unless specified below.    Temp 97.8 F (36.6 C) (Temporal)   Ht '6\' 1"'  (1.854 m)   Wt 224 lb (101.6 kg)   BMI 29.55 kg/m    CC: Covid-19 infection Subjective:   HPI: Philip Callahan is a 65 y.o. male patient of Dr. Damita Dunnings with a history of hyperlipidemia, type 2 diabetes presenting on 08/15/2021 to discuss Covid-19 symptoms.   Symptom onset yesterday with cough, congestion, fever of 103.1, headaches. This morning he's feeling better, temperature was 97.6. His wife tested positive for Covid-19 this morning, he has now tested positive.   He is interested in treatment. He's taken Theraflu OTC. He has completed three total Covid-19 vaccines.       Relevant past medical, surgical, family and social history reviewed and updated as indicated. Interim medical history since our last visit reviewed. Allergies and medications reviewed and updated. Outpatient Medications Prior to Visit  Medication Sig Dispense Refill   atorvastatin (LIPITOR) 10 MG tablet Take 1 tablet (10 mg total) by mouth daily. 90 tablet 3   Cholecalciferol 2000 units TABS Take by mouth daily as needed.     glucose blood (TRUE METRIX BLOOD GLUCOSE TEST)  test strip Use as instructed to test blood sugar once daily.  Dx:  E11.9  Non insulin dependent     Lancets MISC True Metrix Lancets.  Use to test blood sugar once daily.  Dx:  E11.9  Non insulin dependent.     metFORMIN (GLUCOPHAGE) 500 MG tablet Take 1 tablet (500 mg total) by mouth daily with breakfast. 90 tablet 3   Multiple Vitamin (MULTIVITAMIN) tablet Take 1 tablet by mouth as needed.     Blood Glucose Monitoring Suppl (TRUE METRIX METER) w/Device KIT Use to test blood sugar once daily.  Dx:  E11.9  Non insulin dependent.     No facility-administered medications prior to visit.     Per HPI unless specifically indicated in ROS section below Review of Systems  Constitutional:  Positive for fever.  HENT:  Positive for congestion.   Respiratory:  Positive for cough.   Neurological:  Positive for headaches.  Objective:  Temp 97.8 F (36.6 C) (Temporal)   Ht '6\' 1"'  (1.854 m)   Wt 224 lb (101.6 kg)   BMI 29.55 kg/m   Wt Readings from Last 3 Encounters:  08/15/21 224 lb (101.6 kg)  07/04/21 224 lb 4.8 oz (101.7 kg)  06/06/21 232 lb 9 oz (105.5 kg)       Physical exam: Gen: alert, NAD, not ill appearing Pulm: speaks in complete sentences without increased work of breathing Psych: normal mood, normal thought content      Results for orders placed or performed in visit on 06/06/21  POCT HgB A1C  Result Value Ref Range   Hemoglobin A1C 8.3 (A) 4.0 - 5.6 %   HbA1c POC (<> result, manual entry)     HbA1c, POC (prediabetic range)     HbA1c, POC (controlled diabetic range)     Assessment & Plan:   Problem List Items Addressed This Visit       Other   COVID-19 virus infection - Primary    Symptoms onset yesterday, positive test result today.  He qualifies for treatment.  GFR reviewed and is 64 from recent labs. He is not on anticoagulant treatment.  Rx for Paxlovid sent to pharmacy to take BID x 5 days. Return precautions provided.       Relevant Medications    nirmatrelvir/ritonavir EUA (PAXLOVID) 20 x 150 MG & 10 x 100MG TABS     Meds ordered this encounter  Medications   nirmatrelvir/ritonavir EUA (PAXLOVID) 20 x 150 MG & 10 x 100MG TABS    Sig: Take 3 tablets by mouth 2 (two) times daily for 5 days. Patient GFR is 64    Dispense:  30 tablet    Refill:  0    Order Specific Question:   Supervising Provider    Answer:   BEDSOLE, AMY E [[id number]]   No orders of the defined types were placed in this encounter.   I discussed the assessment and treatment plan with the patient. The patient was provided an opportunity to ask questions and all were answered. The patient agreed with the plan and demonstrated an understanding of the instructions. The patient was advised to call back or seek an in-person evaluation if the symptoms worsen or if the condition fails to improve as anticipated.  Follow up plan:  Start the Paxlovid anti-viral medication for Covid-19 infection. Take 3 capsules by mouth twice daily for five days.  Remain at home in quarantine as discussed. Be sure to wear a mask for five days after quarantine.   It was a pleasure meeting you!   Pleas Koch, NP

## 2021-08-15 NOTE — Patient Instructions (Signed)
Start the Paxlovid anti-viral medication for Covid-19 infection. Take 3 capsules by mouth twice daily for five days.  Remain at home in quarantine as discussed. Be sure to wear a mask for five days after quarantine.   It was a pleasure meeting you!

## 2021-08-15 NOTE — Assessment & Plan Note (Signed)
Symptoms onset yesterday, positive test result today.  He qualifies for treatment.  GFR reviewed and is 64 from recent labs. He is not on anticoagulant treatment.  Rx for Paxlovid sent to pharmacy to take BID x 5 days. Return precautions provided.

## 2021-08-18 ENCOUNTER — Ambulatory Visit: Payer: PPO | Admitting: Dietician

## 2021-08-22 ENCOUNTER — Telehealth: Payer: PPO | Admitting: Primary Care

## 2021-09-05 ENCOUNTER — Other Ambulatory Visit: Payer: Self-pay | Admitting: Family Medicine

## 2021-09-07 ENCOUNTER — Ambulatory Visit (INDEPENDENT_AMBULATORY_CARE_PROVIDER_SITE_OTHER): Payer: PPO | Admitting: Family Medicine

## 2021-09-07 ENCOUNTER — Encounter: Payer: Self-pay | Admitting: Family Medicine

## 2021-09-07 ENCOUNTER — Other Ambulatory Visit: Payer: Self-pay

## 2021-09-07 ENCOUNTER — Ambulatory Visit: Payer: PPO | Admitting: Family Medicine

## 2021-09-07 VITALS — BP 122/64 | HR 59 | Temp 97.7°F | Ht 73.0 in | Wt 220.0 lb

## 2021-09-07 DIAGNOSIS — E119 Type 2 diabetes mellitus without complications: Secondary | ICD-10-CM

## 2021-09-07 LAB — POCT GLYCOSYLATED HEMOGLOBIN (HGB A1C): Hemoglobin A1C: 7.2 % — AB (ref 4.0–5.6)

## 2021-09-07 MED ORDER — ATORVASTATIN CALCIUM 10 MG PO TABS: 10.0000 mg | ORAL_TABLET | Freq: Every day | ORAL | 3 refills | Status: DC

## 2021-09-07 NOTE — Progress Notes (Signed)
This visit occurred during the SARS-CoV-2 public health emergency.  Safety protocols were in place, including screening questions prior to the visit, additional usage of staff PPE, and extensive cleaning of exam room while observing appropriate contact time as indicated for disinfecting solutions.  Diabetes:  Using medications without difficulties: he has episodic diarrhea.  He can tolerate as is.  D/w pt about metformin options, dec dose, cessation, change to XR.  He'll consider but continue as is for now.   Hypoglycemic episodes: no Hyperglycemic episodes:no Feet problems:no Blood Sugars averaging: usually ~125, down to 100.   eye exam within last year: yes A1c 7.2.  d/w pt at [place].  Intentional weight loss.  He is still exercising daily.  He has been working on diet, d/w pt.  3 meals.  Eggs for breakfast, lunch is usually sandwich, supper is a meal and vegetables with a slice of bread.  He changed to white wheat.  Overall he clearly cut back on bread and other sweets.    He had likely covid last month, he tested negative.  Wife had similar at the same time.    Meds, vitals, and allergies reviewed.  ROS: Per HPI unless specifically indicated in ROS section   GEN: nad, alert and oriented HEENT: ncat NECK: supple w/o LA CV: rrr. PULM: ctab, no inc wob ABD: soft, +bs EXT: no edema SKIN: well perfused.    Flu shot done today.

## 2021-09-07 NOTE — Patient Instructions (Addendum)
Plan on a yearly visit in about 6 months with labs ahead of time.    Update me as needed.  Take care.  Glad to see you.  Check with your insurance to see if they will cover the shingles shot. Stop metformin if needed from GI upset.

## 2021-09-07 NOTE — Assessment & Plan Note (Signed)
A1c improved, continue diet and exercise, can stop metformin if needed for low sugars and/or GI upset.  See avs.  I thanked him for his effort.  Atorvastatin refill sent.  Recheck in about 6 months, sooner if needed.

## 2021-09-15 ENCOUNTER — Encounter: Payer: PPO | Attending: Family Medicine | Admitting: Dietician

## 2021-09-15 ENCOUNTER — Other Ambulatory Visit: Payer: Self-pay

## 2021-09-15 ENCOUNTER — Encounter: Payer: Self-pay | Admitting: Dietician

## 2021-09-15 VITALS — BP 140/80 | Ht 73.0 in | Wt 222.7 lb

## 2021-09-15 DIAGNOSIS — E119 Type 2 diabetes mellitus without complications: Secondary | ICD-10-CM | POA: Diagnosis not present

## 2021-09-15 NOTE — Patient Instructions (Addendum)
Look for suitable sugar free or low sugar drinks, such as Nature's Twist sugar free, or Tropicana Light (refrigerated). Sugar free flavoring packs are also fine to use. Bring a snack/ fruit/ regular juice drink when swimming in case blood sugar gets low.  Continue to gradually reduce portions of starchy foods, increase protein as needed to make sure to have enough calories and energy.  Try Nature's Own Whole Wheat with honey bread which might be suitable whole grain without too much added sugar.

## 2021-09-15 NOTE — Progress Notes (Signed)
Diabetes Self-Management Education  Visit Type:  Follow-up  Appt. Start Time: 1035 Appt. End Time: 1140  09/15/2021  [name], identified by name and date of birth, is a 65 y.o. male with a diagnosis of Diabetes: Type 2    ASSESSMENT  Blood pressure 140/80, height 6\' 1"  (1.854 m), weight 222 lb 11.2 oz (101 kg). Body mass index is 29.38 kg/m.    Diabetes Self-Management Education - 47/42/59 5638       Complications   How often do you check your blood sugar? 3-4 times / week   1-2x a week average per patient   Fasting Blood glucose range (mg/dL) 70-129;130-179   105-168   Postprandial Blood glucose range (mg/dL) 130-179;70-129   105-168 in past week   Number of hypoglycemic episodes per month 1   occasional hypoglycemic symptoms after swimming   Can you tell when your blood sugar is low? Yes    What do you do if your blood sugar is low? has not treated    Have you had a dilated eye exam in the past 12 months? Yes    Have you had a dental exam in the past 12 months? Yes    Are you checking your feet? Yes    How many days per week are you checking your feet? 7      Dietary Intake   Breakfast 3 meals and 1-2 snacks daily pudding cup or fruit    Lunch 1/2 sandwich and chips    Beverage(s) water; reducing fruit juice drinks, milk; no sodas recently      Exercise   Exercise Type Moderate (swimming / aerobic walking)   lap swimming   How many days per week to you exercise? 5    How many minutes per day do you exercise? 90    Total minutes per week of exercise 450      Patient Education   Nutrition management  Role of diet in the treatment of diabetes and the relationship between the three main macronutrients and blood glucose level;Food label reading, portion sizes and measuring food.;Reviewed blood glucose goals for pre and post meals and how to evaluate the patients' food intake on their blood glucose level.;Meal timing in regards to the patients' current diabetes  medication.;Meal options for control of blood glucose level and chronic complications.    Physical activity and exercise  Other (comment)   effects of exercise on blood sugar   Monitoring Taught/discussed recording of test results and interpretation of SMBG.    Acute complications Taught treatment of hypoglycemia - the 15 rule.      Post-Education Assessment   Patient understands the diabetes disease and treatment process. Demonstrates understanding / competency    Patient understands incorporating nutritional management into lifestyle. Demonstrates understanding / competency    Patient undertands incorporating physical activity into lifestyle. Demonstrates understanding / competency    Patient understands using medications safely. Demonstrates understanding / competency    Patient understands monitoring blood glucose, interpreting and using results Needs Review    Patient understands prevention, detection, and treatment of acute complications. Needs Review   completed brief instruction   Patient understands prevention, detection, and treatment of chronic complications. Demonstrates understanding / competency    Patient understands how to develop strategies to address psychosocial issues. Demonstrates understanding / competency    Patient understands how to develop strategies to promote health/change behavior. Demonstrates understanding / competency      Outcomes   Program Status Completed  Learning Objective:  Patient will have a greater understanding of diabetes self-management. Patient education plan is to attend individual and/or group sessions per assessed needs and concerns.  Additional Notes: Patient is making gradual diet changes to reduce sugar and starch intake. He has personal goal of reaching 200lbs weight.  He feels his food portions of still often well above recommended portions; discussed making incremental changes, also increasing other foods ie proteins to  ensure adequate kcal intake and promote satiety.  Discussed body composition vs weight and that high activity level is promoting lean body weight and decreasing body fat.  Encouraged having a snack such as fruit on hand, as well as small amount of juice in case of hypoglycemic symptoms after exercise.  Plan:   Patient Instructions  Look for suitable sugar free or low sugar drinks, such as Nature's Twist sugar free, or Tropicana Light (refrigerated). Sugar free flavoring packs are also fine to use. Bring a snack/ fruit/ regular juice drink when swimming in case blood sugar gets low.  Continue to gradually reduce portions of starchy foods, increase protein as needed to make sure to have enough calories and energy.  Try Nature's Own Whole Wheat with honey bread which might be suitable whole grain without too much added sugar.   Expected Outcomes:  Demonstrated interest in learning. Expect positive outcomes  Education material provided:  Quick and Simple Meal Ideas; Smart Snacking One Touch Verio Reflect meter Lot [id number]  If problems or questions, patient to contact team via:  Phone  Future DSME appointment: -  prn

## 2021-11-10 DIAGNOSIS — E119 Type 2 diabetes mellitus without complications: Secondary | ICD-10-CM | POA: Diagnosis not present

## 2022-03-02 ENCOUNTER — Ambulatory Visit: Payer: PPO

## 2022-03-06 ENCOUNTER — Ambulatory Visit (INDEPENDENT_AMBULATORY_CARE_PROVIDER_SITE_OTHER): Payer: PPO | Admitting: *Deleted

## 2022-03-06 DIAGNOSIS — Z0000 Encounter for general adult medical examination without abnormal findings: Secondary | ICD-10-CM

## 2022-03-06 DIAGNOSIS — Z1211 Encounter for screening for malignant neoplasm of colon: Secondary | ICD-10-CM | POA: Diagnosis not present

## 2022-03-06 NOTE — Progress Notes (Signed)
? ?Subjective:  ? Philip Callahan is a 66 y.o. male who presents for Medicare Annual/Subsequent preventive examination. ?I connected with  Philip Callahan on 03/06/22 by a  telephone enabled telemedicine application and verified that I am speaking with the correct person using two identifiers. ?  ?I discussed the limitations of evaluation and management by telemedicine. The patient expressed understanding and agreed to proceed. ? ?Patient location: home ? ?Provider location: Tele-Health not in office ? ? ?Review of Systems    ? ?Cardiac Risk Factors include: advanced age (>68mn, >>68women);diabetes mellitus;male gender;family history of premature cardiovascular disease ? ?   ?Objective:  ?  ?Today's Vitals  ? ?There is no height or weight on file to calculate BMI. ? ? ?  03/06/2022  ?  9:12 AM 07/04/2021  ?  8:55 AM 03/01/2021  ?  9:00 AM 02/16/2020  ?  9:03 AM 08/01/2019  ?  8:41 AM 08/01/2019  ?  7:16 AM 11/06/2017  ?  7:19 AM  ?Advanced Directives  ?Does Patient Have a Medical Advance Directive? No No No No No No No  ?Would patient like information on creating a medical advance directive? No - Patient declined No - Patient declined No - Patient declined No - Patient declined No - Patient declined No - Patient declined   ? ? ?Current Medications (verified) ?Outpatient Encounter Medications as of 03/06/2022  ?Medication Sig  ? atorvastatin (LIPITOR) 10 MG tablet Take 1 tablet (10 mg total) by mouth daily.  ? Blood Glucose Monitoring Suppl (TRUE METRIX METER) w/Device KIT Use to test blood sugar once daily.  Dx:  E11.9  Non insulin dependent.  ? Cholecalciferol 2000 units TABS Take by mouth daily as needed.  ? glucose blood (TRUE METRIX BLOOD GLUCOSE TEST) test strip Use as instructed to test blood sugar once daily.  Dx:  E11.9  Non insulin dependent  ? Lancets MISC True Metrix Lancets.  Use to test blood sugar once daily.  Dx:  E11.9  Non insulin dependent.  ? metFORMIN (GLUCOPHAGE) 500 MG tablet Take 1 tablet (500 mg  total) by mouth daily with breakfast.  ? Multiple Vitamin (MULTIVITAMIN) tablet Take 1 tablet by mouth as needed.  ? ?No facility-administered encounter medications on file as of 03/06/2022.  ? ? ?Allergies (verified) ?Patient has no known allergies.  ? ?History: ?Past Medical History:  ?Diagnosis Date  ? Allergic rhinitis 03/2002  ? BPH (benign prostatic hyperplasia) 03/2002  ? Diabetes mellitus without complication ([place]   ? prediabetic per patient - no meds, diet controlled  ? Heart murmur   ? as child, no problems  ? Hyperlipemia 1990's  ? diet controlled  ? Kidney stones   ? passed stones  ? ?Past Surgical History:  ?Procedure Laterality Date  ? AKN  of scalp  01/2001  ? Dermatology: Dr. [place] Crane ? COLONOSCOPY  07/2007  ? ?Family History  ?Problem Relation Age of Onset  ? Heart disease Mother   ?     MI  ? Hypertension Mother   ? Diabetes Mother   ? Hyperlipidemia Mother   ? Stroke Father   ? Hypertension Father   ? Diabetes Father   ? Anemia Father   ? Prostate cancer Father   ? Diabetes Sister   ? Vitiligo Sister   ? Diabetes Brother   ? Cancer Brother   ?     Skin  ? Alcohol abuse Brother   ? Heart disease Brother   ?  Pacemaker  ? Prostate cancer Brother   ? Depression Neg Hx   ? Colon cancer Neg Hx   ? Colon polyps Neg Hx   ? Stomach cancer Neg Hx   ? Rectal cancer Neg Hx   ? ?Social History  ? ?Socioeconomic History  ? Marital status: Married  ?  Spouse name: Not on file  ? Number of children: 2  ? Years of education: Not on file  ? Highest education level: Not on file  ?Occupational History  ? Occupation: [employer]  ?  Employer: [employer]  ?Tobacco Use  ? Smoking status: Never  ? Smokeless tobacco: Never  ?Vaping Use  ? Vaping Use: Never used  ?Substance and Sexual Activity  ? Alcohol use: No  ?  Alcohol/week: 0.0 standard drinks  ? Drug use: No  ? Sexual activity: Yes  ?Other Topics Concern  ? Not on file  ?Social History Narrative  ? Lives with wife, married 1976  ? 1 daughter local as of  2018 and 1 son in [place] (both have done mission work)  ? Retired from [employer], Librarian, academic.  ? [place] fan  ? ?Social Determinants of Health  ? ?Financial Resource Strain: Low Risk   ? Difficulty of Paying Living Expenses: Not hard at all  ?Food Insecurity: No Food Insecurity  ? Worried About Charity fundraiser in the Last Year: Never true  ? Ran Out of Food in the Last Year: Never true  ?Transportation Needs: No Transportation Needs  ? Lack of Transportation (Medical): No  ? Lack of Transportation (Non-Medical): No  ?Physical Activity: Sufficiently Active  ? Days of Exercise per Week: 4 days  ? Minutes of Exercise per Session: 40 min  ?Stress: No Stress Concern Present  ? Feeling of Stress : Not at all  ?Social Connections: Socially Integrated  ? Frequency of Communication with Friends and Family: More than three times a week  ? Frequency of Social Gatherings with Friends and Family: Never  ? Attends Religious Services: More than 4 times per year  ? Active Member of Clubs or Organizations: Yes  ? Attends Archivist Meetings: More than 4 times per year  ? Marital Status: Married  ? ? ?Tobacco Counseling ?Counseling given: Not Answered ? ? ?Clinical Intake: ? ?Pre-visit preparation completed: Yes ? ?Pain : No/denies pain ? ?  ? ?Nutritional Risks: None ?Diabetes: Yes ?CBG done?: No ?Did pt. bring in CBG monitor from home?: No ? ?How often do you need to have someone help you when you read instructions, pamphlets, or other written materials from your doctor or pharmacy?: 1 - Never ? ?Diabetic?  Yes   ? ?Nutrition Risk Assessment: ? ?Has the patient had any N/V/D within the last 2 months?  No  ?Does the patient have any non-healing wounds?  No  ?Has the patient had any unintentional weight loss or weight gain?  No  ? ?Diabetes: ? ?Is the patient diabetic?  Yes  ?If diabetic, was a CBG obtained today?  No  ?Did the patient bring in their glucometer from home?  No  ?How often do you monitor  your CBG's? 1-2 times weekly.  ? ?Financial Strains and Diabetes Management: ? ?Are you having any financial strains with the device, your supplies or your medication? No .  ?Does the patient want to be seen by Chronic Care Management for management of their diabetes?  No  ?Would the patient like to be referred to a Nutritionist  or for Diabetic Management?  No  ? ?Diabetic Exams: ? ?Diabetic Eye Exam: Completed . Pt has been advised about the importance in completing this exam.  ?Diabetic Foot Exam:  Pt has been advised about the importance in completing this exam.  ? ? ?Interpreter Needed?: No ? ?Information entered by :: Leroy Kennedy LPN ? ? ?Activities of Daily Living ? ?  03/06/2022  ?  9:27 AM  ?In your present state of health, do you have any difficulty performing the following activities:  ?Hearing? 0  ?Vision? 0  ?Difficulty concentrating or making decisions? 0  ?Walking or climbing stairs? 0  ?Dressing or bathing? 0  ?Doing errands, shopping? 0  ?Preparing Food and eating ? N  ?Using the Toilet? N  ?In the past six months, have you accidently leaked urine? N  ?Do you have problems with loss of bowel control? N  ?Managing your Medications? N  ?Managing your Finances? N  ?Housekeeping or managing your Housekeeping? N  ? ? ?Patient Care Team: ?Tonia Ghent, MD as PCP - General (Family Medicine) ? ?Indicate any recent Medical Services you may have received from other than [facility] providers in the past year (date may be approximate). ? ?   ?Assessment:  ? This is a routine wellness examination for Oday. ? ?Hearing/Vision screen ?Hearing Screening - Comments:: No trouble hearing ?Vision Screening - Comments:: Up to date ?Unsure of name ? ?Dietary issues and exercise activities discussed: ?Current Exercise Habits: Structured exercise class, Type of exercise: Other - see comments (swims for an hour at y 5 days a week), Time (Minutes): 60, Frequency (Times/Week): 5, Weekly Exercise (Minutes/Week): 300, Intensity:  Moderate ? ? Goals Addressed   ? ?  ?  ?  ?  ? This Visit's Progress  ?  Patient Stated     ?  Continue current lifestyle ?  ? ?  ? ?Depression Screen ? ?  03/06/2022  ?  9:18 AM 07/04/2021  ?  8:56 AM 03/01/2021  ?

## 2022-03-06 NOTE — Patient Instructions (Signed)
Mr. Ammon , ?Thank you for taking time to come for your Medicare Wellness Visit. I appreciate your ongoing commitment to your health goals. Please review the following plan we discussed and let me know if I can assist you in the future.  ? ?Screening recommendations/referrals: ?Colonoscopy: Education provided ?Recommended yearly ophthalmology/optometry visit for glaucoma screening and checkup ?Recommended yearly dental visit for hygiene and checkup ? ?Vaccinations: ?Influenza vaccine: up to date ?Pneumococcal vaccine: Education provided ?Tdap vaccine: up to date ?Shingles vaccine: 1 of 2   ? ?Advanced directives: Education provided ? ?Conditions/risks identified:  ? ?Preventive Care 68 Years and Older, Male ?Preventive care refers to lifestyle choices and visits with your health care provider that can promote health and wellness. ?What does preventive care include? ?A yearly physical exam. This is also called an annual well check. ?Dental exams once or twice a year. ?Routine eye exams. Ask your health care provider how often you should have your eyes checked. ?Personal lifestyle choices, including: ?Daily care of your teeth and gums. ?Regular physical activity. ?Eating a healthy diet. ?Avoiding tobacco and drug use. ?Limiting alcohol use. ?Practicing safe sex. ?Taking low doses of aspirin every day. ?Taking vitamin and mineral supplements as recommended by your health care provider. ?What happens during an annual well check? ?The services and screenings done by your health care provider during your annual well check will depend on your age, overall health, lifestyle risk factors, and family history of disease. ?Counseling  ?Your health care provider may ask you questions about your: ?Alcohol use. ?Tobacco use. ?Drug use. ?Emotional well-being. ?Home and relationship well-being. ?Sexual activity. ?Eating habits. ?History of falls. ?Memory and ability to understand (cognition). ?Work and work Statistician. ?Screening   ?You may have the following tests or measurements: ?Height, weight, and BMI. ?Blood pressure. ?Lipid and cholesterol levels. These may be checked every 5 years, or more frequently if you are over 35 years old. ?Skin check. ?Lung cancer screening. You may have this screening every year starting at age 68 if you have a 30-pack-year history of smoking and currently smoke or have quit within the past 15 years. ?Fecal occult blood test (FOBT) of the stool. You may have this test every year starting at age 68. ?Flexible sigmoidoscopy or colonoscopy. You may have a sigmoidoscopy every 5 years or a colonoscopy every 10 years starting at age 68. ?Prostate cancer screening. Recommendations will vary depending on your family history and other risks. ?Hepatitis C blood test. ?Hepatitis B blood test. ?Sexually transmitted disease (STD) testing. ?Diabetes screening. This is done by checking your blood sugar (glucose) after you have not eaten for a while (fasting). You may have this done every 1-3 years. ?Abdominal aortic aneurysm (AAA) screening. You may need this if you are a current or former smoker. ?Osteoporosis. You may be screened starting at age 68 if you are at high risk. ?Talk with your health care provider about your test results, treatment options, and if necessary, the need for more tests. ?Vaccines  ?Your health care provider may recommend certain vaccines, such as: ?Influenza vaccine. This is recommended every year. ?Tetanus, diphtheria, and acellular pertussis (Tdap, Td) vaccine. You may need a Td booster every 10 years. ?Zoster vaccine. You may need this after age 68. ?Pneumococcal 13-valent conjugate (PCV13) vaccine. One dose is recommended after age 68. ?Pneumococcal polysaccharide (PPSV23) vaccine. One dose is recommended after age 34. ?Talk to your health care provider about which screenings and vaccines you need and how often you need them. ?  This information is not intended to replace advice given to you by  your health care provider. Make sure you discuss any questions you have with your health care provider. ?Document Released: 12/09/2015 Document Revised: 08/01/2016 Document Reviewed: 09/13/2015 ?Elsevier Interactive Patient Education ? 2017 [place]. ? ?Fall Prevention in the Home ?Falls can cause injuries. They can happen to people of all ages. There are many things you can do to make your home safe and to help prevent falls. ?What can I do on the outside of my home? ?Regularly fix the edges of walkways and driveways and fix any cracks. ?Remove anything that might make you trip as you walk through a door, such as a raised step or threshold. ?Trim any bushes or trees on the path to your home. ?Use bright outdoor lighting. ?Clear any walking paths of anything that might make someone trip, such as rocks or tools. ?Regularly check to see if handrails are loose or broken. Make sure that both sides of any steps have handrails. ?Any raised decks and porches should have guardrails on the edges. ?Have any leaves, snow, or ice cleared regularly. ?Use sand or salt on walking paths during winter. ?Clean up any spills in your garage right away. This includes oil or grease spills. ?What can I do in the bathroom? ?Use night lights. ?Install grab bars by the toilet and in the tub and shower. Do not use towel bars as grab bars. ?Use non-skid mats or decals in the tub or shower. ?If you need to sit down in the shower, use a plastic, non-slip stool. ?Keep the floor dry. Clean up any water that spills on the floor as soon as it happens. ?Remove soap buildup in the tub or shower regularly. ?Attach bath mats securely with double-sided non-slip rug tape. ?Do not have throw rugs and other things on the floor that can make you trip. ?What can I do in the bedroom? ?Use night lights. ?Make sure that you have a light by your bed that is easy to reach. ?Do not use any sheets or blankets that are too big for your bed. They should not hang  down onto the floor. ?Have a firm chair that has side arms. You can use this for support while you get dressed. ?Do not have throw rugs and other things on the floor that can make you trip. ?What can I do in the kitchen? ?Clean up any spills right away. ?Avoid walking on wet floors. ?Keep items that you use a lot in easy-to-reach places. ?If you need to reach something above you, use a strong step stool that has a grab bar. ?Keep electrical cords out of the way. ?Do not use floor polish or wax that makes floors slippery. If you must use wax, use non-skid floor wax. ?Do not have throw rugs and other things on the floor that can make you trip. ?What can I do with my stairs? ?Do not leave any items on the stairs. ?Make sure that there are handrails on both sides of the stairs and use them. Fix handrails that are broken or loose. Make sure that handrails are as long as the stairways. ?Check any carpeting to make sure that it is firmly attached to the stairs. Fix any carpet that is loose or worn. ?Avoid having throw rugs at the top or bottom of the stairs. If you do have throw rugs, attach them to the floor with carpet tape. ?Make sure that you have a light switch at the  top of the stairs and the bottom of the stairs. If you do not have them, ask someone to add them for you. ?What else can I do to help prevent falls? ?Wear shoes that: ?Do not have high heels. ?Have rubber bottoms. ?Are comfortable and fit you well. ?Are closed at the toe. Do not wear sandals. ?If you use a stepladder: ?Make sure that it is fully opened. Do not climb a closed stepladder. ?Make sure that both sides of the stepladder are locked into place. ?Ask someone to hold it for you, if possible. ?Clearly mark and make sure that you can see: ?Any grab bars or handrails. ?First and last steps. ?Where the edge of each step is. ?Use tools that help you move around (mobility aids) if they are needed. These  include: ?Canes. ?Walkers. ?Scooters. ?Crutches. ?Turn on the lights when you go into a dark area. Replace any light bulbs as soon as they burn out. ?Set up your furniture so you have a clear path. Avoid moving your furniture around. ?If any of your

## 2022-06-15 ENCOUNTER — Other Ambulatory Visit: Payer: Self-pay | Admitting: Family Medicine

## 2022-06-25 ENCOUNTER — Ambulatory Visit: Payer: PPO | Admitting: Family Medicine

## 2022-07-13 ENCOUNTER — Ambulatory Visit (INDEPENDENT_AMBULATORY_CARE_PROVIDER_SITE_OTHER): Payer: PPO | Admitting: Family Medicine

## 2022-07-13 ENCOUNTER — Encounter: Payer: Self-pay | Admitting: Family Medicine

## 2022-07-13 VITALS — BP 130/70 | HR 80 | Temp 96.3°F | Ht 73.0 in | Wt 223.0 lb

## 2022-07-13 DIAGNOSIS — Z125 Encounter for screening for malignant neoplasm of prostate: Secondary | ICD-10-CM | POA: Diagnosis not present

## 2022-07-13 DIAGNOSIS — E119 Type 2 diabetes mellitus without complications: Secondary | ICD-10-CM

## 2022-07-13 DIAGNOSIS — Z8042 Family history of malignant neoplasm of prostate: Secondary | ICD-10-CM

## 2022-07-13 DIAGNOSIS — E785 Hyperlipidemia, unspecified: Secondary | ICD-10-CM

## 2022-07-13 LAB — LIPID PANEL
Cholesterol: 154 mg/dL (ref 0–200)
HDL: 43.6 mg/dL (ref >39.00)
NonHDL: 110.41
Total CHOL/HDL Ratio: 4
Triglycerides: 230 mg/dL — ABNORMAL HIGH (ref 0.0–149.0)
VLDL: 46 mg/dL — ABNORMAL HIGH (ref 0.0–40.0)

## 2022-07-13 LAB — MICROALBUMIN / CREATININE URINE RATIO
Creatinine,U: 212 mg/dL
Microalb Creat Ratio: 0.4 mg/g (ref 0.0–30.0)
Microalb, Ur: 0.8 mg/dL (ref 0.0–1.9)

## 2022-07-13 LAB — COMPREHENSIVE METABOLIC PANEL
ALT: 18 U/L (ref 0–53)
AST: 18 U/L (ref 0–37)
Albumin: 4.1 g/dL (ref 3.5–5.2)
Alkaline Phosphatase: 77 U/L (ref 39–117)
BUN: 12 mg/dL (ref 6–23)
CO2: 28 mEq/L (ref 19–32)
Calcium: 9.1 mg/dL (ref 8.4–10.5)
Chloride: 105 mEq/L (ref 96–112)
Creatinine, Ser: 1.11 mg/dL (ref 0.40–1.50)
GFR: 68.29 mL/min (ref >60.00)
Glucose, Bld: 208 mg/dL — ABNORMAL HIGH (ref 70–99)
Potassium: 3.8 mEq/L (ref 3.5–5.1)
Sodium: 139 mEq/L (ref 135–145)
Total Bilirubin: 0.5 mg/dL (ref 0.2–1.2)
Total Protein: 6.8 g/dL (ref 6.0–8.3)

## 2022-07-13 LAB — PSA, MEDICARE: PSA: 0.45 ng/ml (ref 0.10–4.00)

## 2022-07-13 LAB — HEMOGLOBIN A1C: Hgb A1c MFr Bld: 7.5 % — ABNORMAL HIGH (ref 4.6–6.5)

## 2022-07-13 LAB — LDL CHOLESTEROL, DIRECT: Direct LDL: 80 mg/dL

## 2022-07-13 MED ORDER — ATORVASTATIN CALCIUM 10 MG PO TABS: 10.0000 mg | ORAL_TABLET | Freq: Every day | ORAL | 3 refills | Status: DC

## 2022-07-13 NOTE — Progress Notes (Unsigned)
Diabetes:  Using medications without difficulties: yes Hypoglycemic episodes: he felt like his sugar was low after exercise yesterday, checked sugar 105 at the time.  He usually exercises after breakfast. He swam for 1 hour yesterday.  That is his typical duration.   Hyperglycemic episodes:no Feet problems:no Blood Sugars averaging: ~110.  eye exam within last year: due, d/w pt.   Labs pending.    Elevated Cholesterol: Using medications without problems: yes Muscle aches: no Diet compliance: yes Exercise: yes Labs pending.    He isn't fasting but we talked about getting his labs done.  Had cheerios prior to OV.    FH prostate cancer.  Prostate cancer screening and PSA options (with potential risks and benefits of testing vs not testing) were discussed along with recent recs/guidelines.  He opted testing PSA at this point.  Fingernail changes.   He had noted longitudinal ridges in his fingernails.    Meds, vitals, and allergies reviewed.   ROS: Per HPI unless specifically indicated in ROS section   GEN: nad, alert and oriented HEENT: mucous membranes moist NECK: supple w/o LA CV: rrr. PULM: ctab, no inc wob ABD: soft, +bs EXT: no edema SKIN: no acute rash  Diabetic foot exam: Normal inspection No skin breakdown No calluses  Normal DP pulses Normal sensation to light touch and monofilament Nails normal

## 2022-07-13 NOTE — Patient Instructions (Signed)
Go to the lab on the way out.   If you have mychart we'll likely use that to update you.    Take care.  Glad to see you. Check your sugar after exercise to get a baseline.  Take a snack with exercise.   Call about an eye exam when possible.

## 2022-07-15 NOTE — Assessment & Plan Note (Signed)
Continue metformin.  See notes on labs.  Continue work on diet and exercise. 

## 2022-07-15 NOTE — Assessment & Plan Note (Signed)
Continue atorvastatin.  See notes on labs.  Continue work on diet and exercise.

## 2022-07-15 NOTE — Assessment & Plan Note (Signed)
Seen on PSA.

## 2022-07-19 ENCOUNTER — Telehealth: Payer: Self-pay

## 2022-07-19 NOTE — Telephone Encounter (Signed)
I left a message for the patient to return my call.

## 2022-07-19 NOTE — Telephone Encounter (Signed)
-----   Message from Tonia Ghent, MD sent at 07/15/2022  4:16 PM EDT ----- Notify patient.  Glucose was elevated but he had also had breakfast so this was expected.  Kidney and liver tests are fine.  Cholesterol is better.  Triglycerides still slightly elevated.  A1c is up slightly but not enough to change his medication.  Microalbumin is normal.  PSA is normal.  I would continue as planned.  I like to recheck his A1c in about 6 months at a visit.  Thanks.

## 2022-07-23 NOTE — Telephone Encounter (Signed)
Spoke with patient regarding labs.

## 2022-10-24 ENCOUNTER — Encounter: Payer: Self-pay | Admitting: Gastroenterology

## 2023-01-21 ENCOUNTER — Encounter: Payer: Self-pay | Admitting: Family Medicine

## 2023-01-21 ENCOUNTER — Ambulatory Visit (INDEPENDENT_AMBULATORY_CARE_PROVIDER_SITE_OTHER): Payer: PPO | Admitting: Family Medicine

## 2023-01-21 VITALS — BP 120/82 | HR 116 | Temp 96.5°F | Ht 73.0 in | Wt 214.0 lb

## 2023-01-21 DIAGNOSIS — F4321 Adjustment disorder with depressed mood: Secondary | ICD-10-CM | POA: Diagnosis not present

## 2023-01-21 DIAGNOSIS — E119 Type 2 diabetes mellitus without complications: Secondary | ICD-10-CM | POA: Diagnosis not present

## 2023-01-21 LAB — POCT GLYCOSYLATED HEMOGLOBIN (HGB A1C): Hemoglobin A1C: 7.1 % — AB (ref 4.0–5.6)

## 2023-01-21 MED ORDER — DIPHENHYDRAMINE HCL 25 MG PO TABS: 25.0000 mg | ORAL_TABLET | Freq: Every evening | ORAL | Status: DC | PRN

## 2023-01-21 MED ORDER — TRAZODONE HCL 50 MG PO TABS: 25.0000 mg | ORAL_TABLET | Freq: Every day | ORAL | 1 refills | Status: DC

## 2023-01-21 NOTE — Patient Instructions (Signed)
Try benadryl '25mg'$  at night if needed for sleep.  Take about 1 hour prior to trying to go to bed.   If that isn't helping then try trazodone at night.  I wouldn't take them together.    If neither help, then let me know.   You could try weaning off when your sleep is back on schedule.    Plan on recheck in about 6 months but please update me in the meantime.    Take care.  Glad to see you.

## 2023-01-21 NOTE — Progress Notes (Signed)
Diabetes:  Using medications without difficulties: yes Hypoglycemic episodes: no Hyperglycemic episodes: no Feet problems: no Blood Sugars averaging: <200.   A1c done at OV.  7.1.    His wife passed from cancer.  D/w pt.  Condolences offered.  He isn't sleeping well, can't get to sleep.  Discussed options.  She was in [place] with their kids. His kids are in [place].    Meds, vitals, and allergies reviewed.  ROS: Per HPI unless specifically indicated in ROS section   GEN: nad, alert and oriented HEENT: mucous membranes moist NECK: supple w/o LA CV: rrr. PULM: ctab, no inc wob ABD: soft, +bs EXT: no edema SKIN: no acute rash

## 2023-01-23 DIAGNOSIS — F4321 Adjustment disorder with depressed mood: Secondary | ICD-10-CM | POA: Insufficient documentation

## 2023-01-23 NOTE — Assessment & Plan Note (Signed)
Discussed options, condolences offered.  Reasonable to try benadryl '25mg'$  at night if needed for sleep.  Take about 1 hour prior to trying to go to bed.   If that isn't helping then try trazodone at night.  I wouldn't take them together.  Discussed.  Routine cautions given.  If neither help, then he can let me know.   He could try weaning off when his sleep is back on schedule.

## 2023-01-23 NOTE — Assessment & Plan Note (Signed)
A1c reasonable at 7.1 and given recent circumstances I do not think it makes sense to change his medications.  Continue metformin as is and we can recheck before visit this summer.

## 2023-01-31 ENCOUNTER — Telehealth: Payer: Self-pay | Admitting: Family Medicine

## 2023-01-31 NOTE — Telephone Encounter (Signed)
Contacted [facility] to schedule their annual wellness visit. Appointment made for 03/11/2023.  [facility] Dial: [phone]

## 2023-03-11 ENCOUNTER — Ambulatory Visit (INDEPENDENT_AMBULATORY_CARE_PROVIDER_SITE_OTHER): Payer: PPO

## 2023-03-11 VITALS — Ht 72.0 in | Wt 215.0 lb

## 2023-03-11 DIAGNOSIS — Z1211 Encounter for screening for malignant neoplasm of colon: Secondary | ICD-10-CM | POA: Diagnosis not present

## 2023-03-11 DIAGNOSIS — Z0000 Encounter for general adult medical examination without abnormal findings: Secondary | ICD-10-CM | POA: Diagnosis not present

## 2023-03-11 NOTE — Progress Notes (Signed)
I connected with  [name] on 03/11/23 by a audio enabled telemedicine application and verified that I am speaking with the correct person using two identifiers.  Patient Location: Home  Provider Location: Office/Clinic  I discussed the limitations of evaluation and management by telemedicine. The patient expressed understanding and agreed to proceed.  Subjective:   Philip Callahan is a 67 y.o. male who presents for Medicare Annual/Subsequent preventive examination.  Review of Systems      Cardiac Risk Factors include: advanced age (>41men, >71 women);male gender;diabetes mellitus     Objective:    Today's Vitals   03/11/23 0946  Weight: 215 lb (97.5 kg)  Height: 6' (1.829 m)   Body mass index is 29.16 kg/m.     03/11/2023    9:59 AM 03/06/2022    9:12 AM 07/04/2021    8:55 AM 03/01/2021    9:00 AM 02/16/2020    9:03 AM 08/01/2019    8:41 AM 08/01/2019    7:16 AM  Advanced Directives  Does Patient Have a Medical Advance Directive? No No No No No No No  Would patient like information on creating a medical advance directive? No - Patient declined No - Patient declined No - Patient declined No - Patient declined No - Patient declined No - Patient declined No - Patient declined    Current Medications (verified) Outpatient Encounter Medications as of 03/11/2023  Medication Sig   Ascorbic Acid (VITAMIN C) 500 MG CAPS Take by mouth.   atorvastatin (LIPITOR) 10 MG tablet Take 1 tablet (10 mg total) by mouth daily.   Blood Glucose Monitoring Suppl (TRUE METRIX METER) w/Device KIT Use to test blood sugar once daily.  Dx:  E11.9  Non insulin dependent.   Cholecalciferol 2000 units TABS Take by mouth daily as needed.   diphenhydrAMINE (BENADRYL ALLERGY) 25 MG tablet Take 1 tablet (25 mg total) by mouth at bedtime as needed for sleep.   glucose blood (TRUE METRIX BLOOD GLUCOSE TEST) test strip Use as instructed to test blood sugar once daily.  Dx:  E11.9  Non insulin dependent    Lancets MISC True Metrix Lancets.  Use to test blood sugar once daily.  Dx:  E11.9  Non insulin dependent.   metFORMIN (GLUCOPHAGE) 500 MG tablet TAKE 1 TABLET BY MOUTH EVERY DAY WITH BREAKFAST   Multiple Vitamin (MULTIVITAMIN) tablet Take 1 tablet by mouth as needed.   traZODone (DESYREL) 50 MG tablet Take 0.5-1 tablets (25-50 mg total) by mouth at bedtime.   No facility-administered encounter medications on file as of 03/11/2023.    Allergies (verified) Patient has no known allergies.   History: Past Medical History:  Diagnosis Date   Allergic rhinitis 03/2002   BPH (benign prostatic hyperplasia) 03/2002   Diabetes mellitus without complication    prediabetic per patient - no meds, diet controlled   Heart murmur    as child, no problems   Hyperlipemia 1990's   diet controlled   Kidney stones    passed stones   Past Surgical History:  Procedure Laterality Date   AKN  of scalp  01/2001   Dermatology: Dr. Margo Aye   COLONOSCOPY  07/2007   Family History  Problem Relation Age of Onset   Heart disease Mother        MI   Hypertension Mother    Diabetes Mother    Hyperlipidemia Mother    Stroke Father    Hypertension Father    Diabetes Father  Anemia Father    Prostate cancer Father    Diabetes Sister    Vitiligo Sister    Diabetes Brother    Cancer Brother        Skin   Alcohol abuse Brother    Heart disease Brother        Pacemaker   Prostate cancer Brother    Depression Neg Hx    Colon cancer Neg Hx    Colon polyps Neg Hx    Stomach cancer Neg Hx    Rectal cancer Neg Hx    Social History   Socioeconomic History   Marital status: Widowed    Spouse name: Not on file   Number of children: 2   Years of education: Not on file   Highest education level: Not on file  Occupational History   Occupation: Research scientist (physical sciences): [employer]  Tobacco Use   Smoking status: Never   Smokeless tobacco: Never  Vaping Use   Vaping Use: Never used   Substance and Sexual Activity   Alcohol use: No    Alcohol/week: 0.0 standard drinks of alcohol   Drug use: No   Sexual activity: Yes  Other Topics Concern   Not on file  Social History Narrative   Widowed 2024, was married 1976   1 daughter local as of 2018 and 1 son moved back 2022 from [place] (both have done mission work)   Retired from [employer], Merchandiser, retail.   [place] [employer] fan   Social Determinants of Health   Financial Resource Strain: Low Risk  (03/11/2023)   Overall Financial Resource Strain (CARDIA)    Difficulty of Paying Living Expenses: Not hard at all  Food Insecurity: No Food Insecurity (03/11/2023)   Hunger Vital Sign    Worried About Running Out of Food in the Last Year: Never true    Ran Out of Food in the Last Year: Never true  Transportation Needs: No Transportation Needs (03/11/2023)   PRAPARE - Administrator, Civil Service (Medical): No    Lack of Transportation (Non-Medical): No  Physical Activity: Sufficiently Active (03/11/2023)   Exercise Vital Sign    Days of Exercise per Week: 5 days    Minutes of Exercise per Session: 60 min  Stress: No Stress Concern Present (03/11/2023)   [employer] of Occupational Health - Occupational Stress Questionnaire    Feeling of Stress : Not at all  Social Connections: Moderately Integrated (03/11/2023)   Social Connection and Isolation Panel [NHANES]    Frequency of Communication with Friends and Family: Three times a week    Frequency of Social Gatherings with Friends and Family: Three times a week    Attends Religious Services: More than 4 times per year    Active Member of Clubs or Organizations: Yes    Attends Banker Meetings: More than 4 times per year    Marital Status: Widowed    Tobacco Counseling Counseling given: Not Answered   Clinical Intake:  Pre-visit preparation completed: Yes  Pain : No/denies pain     Nutritional Risks: None Diabetes: Yes CBG done?:  No CBG resulted in Enter/ Edit results?: No Did pt. bring in CBG monitor from home?: No  How often do you need to have someone help you when you read instructions, pamphlets, or other written materials from your doctor or pharmacy?: 1 - Never  Diabetic?Nutrition Risk Assessment:  Has the patient had any N/V/D within the last 2 months?  No  Does the patient have any non-healing wounds?  No  Has the patient had any unintentional weight loss or weight gain?  No   Diabetes:  Is the patient diabetic?  Yes  If diabetic, was a CBG obtained today?  No  Did the patient bring in their glucometer from home?  No  How often do you monitor your CBG's? 1x weekly.   Financial Strains and Diabetes Management:  Are you having any financial strains with the device, your supplies or your medication? No .  Does the patient want to be seen by Chronic Care Management for management of their diabetes?  No  Would the patient like to be referred to a Nutritionist or for Diabetic Management?  No   Diabetic Exams:  Diabetic Eye Exam: Completed [facility] 07/05/21 Diabetic Foot Exam: Completed 07/13/22 PCP    Interpreter Needed?: No  Information entered by :: C.Chibuikem Thang LPN   Activities of Daily Living    03/11/2023    9:59 AM  In your present state of health, do you have any difficulty performing the following activities:  Hearing? 0  Vision? 0  Difficulty concentrating or making decisions? 0  Walking or climbing stairs? 0  Dressing or bathing? 0  Doing errands, shopping? 0  Preparing Food and eating ? N  Using the Toilet? N  In the past six months, have you accidently leaked urine? N  Do you have problems with loss of bowel control? N  Managing your Medications? N  Managing your Finances? N  Housekeeping or managing your Housekeeping? N    Patient Care Team: Joaquim Nam, MD as PCP - General (Family Medicine)  Indicate any recent Medical Services you may have received from other  than [facility] providers in the past year (date may be approximate).     Assessment:   This is a routine wellness examination for Irl.  Hearing/Vision screen Hearing Screening - Comments:: No aid Vision Screening - Comments:: Glasses - [facility]  Dietary issues and exercise activities discussed: Current Exercise Habits: Home exercise routine, Type of exercise: Other - see comments (swims), Time (Minutes): 60, Frequency (Times/Week): 5, Weekly Exercise (Minutes/Week): 300, Intensity: Mild, Exercise limited by: None identified   Goals Addressed             This Visit's Progress    Patient Stated       Lose 12 pounds.       Depression Screen    03/11/2023    9:57 AM 01/21/2023    8:15 AM 03/06/2022    9:18 AM 07/04/2021    8:56 AM 03/01/2021    9:01 AM 02/16/2020    9:03 AM 08/15/2017    4:08 PM  PHQ 2/9 Scores  PHQ - 2 Score 0 2 0 0 0 0 0  PHQ- 9 Score 0 8   0 0     Fall Risk    03/11/2023    9:51 AM 01/21/2023    8:14 AM 03/06/2022    9:13 AM 09/15/2021   10:38 AM 07/04/2021    8:56 AM  Fall Risk   Falls in the past year? 0 0 0 0 0  Number falls in past yr: 0 0 0    Injury with Fall? 0 0 0    Risk for fall due to : No Fall Risks No Fall Risks     Follow up Falls prevention discussed;Falls evaluation completed Falls evaluation completed Falls evaluation completed;Education provided;Falls prevention discussed  FALL RISK PREVENTION PERTAINING TO THE HOME:  Any stairs in or around the home? No  If so, are there any without handrails? No  Home free of loose throw rugs in walkways, pet beds, electrical cords, etc? Yes  Adequate lighting in your home to reduce risk of falls? Yes   ASSISTIVE DEVICES UTILIZED TO PREVENT FALLS:  Life alert? No  Use of a cane, walker or w/c? No  Grab bars in the bathroom? Yes  Shower chair or bench in shower? No  Elevated toilet seat or a handicapped toilet? Yes    Cognitive Function:    03/01/2021    9:06 AM 02/16/2020    9:05 AM   MMSE - Mini Mental State Exam  Orientation to time 5 5  Orientation to Place 5 5  Registration 3 3  Attention/ Calculation 5 5  Recall 3 3  Language- repeat 1 1        03/11/2023   10:00 AM 03/06/2022    9:14 AM  6CIT Screen  What Year? 0 points 0 points  What month? 0 points 0 points  What time? 0 points 0 points  Count back from 20 0 points 0 points  Months in reverse 0 points 0 points  Repeat phrase 0 points 0 points  Total Score 0 points 0 points    Immunizations Immunization History  Administered Date(s) Administered   Fluad Quad(high Dose 65+) 10/30/2019, 08/11/2020   Influenza Split 10/11/2011, 10/14/2012   Influenza Whole 09/23/2008, 09/26/2009, 10/05/2010   Influenza, High Dose Seasonal PF 10/15/2022   Influenza,inj,Quad PF,6+ Mos 11/03/2014, 08/15/2017   PFIZER(Purple Top)SARS-COV-2 Vaccination 01/11/2020, 02/01/2020, 10/27/2020   Td 03/31/2004   Tdap 11/03/2014   Zoster Recombinat (Shingrix) 01/17/2022, 03/21/2022    TDAP status: Up to date  Flu Vaccine status: Up to date  Pneumococcal vaccine status: Due, Education has been provided regarding the importance of this vaccine. Advised may receive this vaccine at local pharmacy or Health Dept. Aware to provide a copy of the vaccination record if obtained from local pharmacy or Health Dept. Verbalized acceptance and understanding.  Covid-19 vaccine status: Information provided on how to obtain vaccines.   Qualifies for Shingles Vaccine? Yes   Zostavax completed No   Shingrix Completed?: Yes  Screening Tests Health Maintenance  Topic Date Due   Pneumonia Vaccine 66+ Years old (1 of 1 - PCV) Never done   OPHTHALMOLOGY EXAM  07/05/2022   COLONOSCOPY (Pts 45-39yrs Insurance coverage will need to be confirmed)  11/06/2022   INFLUENZA VACCINE  06/27/2023   Diabetic kidney evaluation - eGFR measurement  07/14/2023   Diabetic kidney evaluation - Urine ACR  07/14/2023   FOOT EXAM  07/14/2023   HEMOGLOBIN A1C   07/22/2023   Medicare Annual Wellness (AWV)  03/10/2024   DTaP/Tdap/Td (3 - Td or Tdap) 11/03/2024   Hepatitis C Screening  Completed   Zoster Vaccines- Shingrix  Completed   HPV VACCINES  Aged Out   COVID-19 Vaccine  Discontinued    Health Maintenance  Health Maintenance Due  Topic Date Due   Pneumonia Vaccine 85+ Years old (1 of 1 - PCV) Never done   OPHTHALMOLOGY EXAM  07/05/2022   COLONOSCOPY (Pts 45-7yrs Insurance coverage will need to be confirmed)  11/06/2022    Colorectal cancer screening: Type of screening: Colonoscopy. Completed 11/06/17. Repeat every 5 years order placed.  Lung Cancer Screening: (Low Dose CT Chest recommended if Age 69-80 years, 30 pack-year currently smoking OR have quit w/in 15years.)  does not qualify.   Lung Cancer Screening Referral: no  Additional Screening:  Hepatitis C Screening: does qualify; Completed 08/13/17  Vision Screening: Recommended annual ophthalmology exams for early detection of glaucoma and other disorders of the eye. Is the patient up to date with their annual eye exam?  Yes  Who is the provider or what is the name of the office in which the patient attends annual eye exams? [facility] If pt is not established with a provider, would they like to be referred to a provider to establish care? No .   Dental Screening: Recommended annual dental exams for proper oral hygiene  Community Resource Referral / Chronic Care Management: CRR required this visit?  No   CCM required this visit?  No      Plan:     I have personally reviewed and noted the following in the patient's chart:   Medical and social history Use of alcohol, tobacco or illicit drugs  Current medications and supplements including opioid prescriptions. Patient is not currently taking opioid prescriptions. Functional ability and status Nutritional status Physical activity Advanced directives List of other physicians Hospitalizations, surgeries, and ER  visits in previous 12 months Vitals Screenings to include cognitive, depression, and falls Referrals and appointments  In addition, I have reviewed and discussed with patient certain preventive protocols, quality metrics, and best practice recommendations. A written personalized care plan for preventive services as well as general preventive health recommendations were provided to patient.     Maryan Puls, LPN   1/61/0960   Nurse Notes: Colonoscopy order placed.

## 2023-03-11 NOTE — Patient Instructions (Signed)
Philip Callahan , Thank you for taking time to come for your Medicare Wellness Visit. I appreciate your ongoing commitment to your health goals. Please review the following plan we discussed and let me know if I can assist you in the future.   These are the goals we discussed:  Goals      Patient Stated     02/16/2020, I will continue to swim 6 days a week for 45 minutes.      Patient Stated     03/01/2021, I will continue swimming 6 days a week for 1 hour.     Patient Stated     Continue current lifestyle     Patient Stated     Lose 12 pounds.        This is a list of the screening recommended for you and due dates:  Health Maintenance  Topic Date Due   Pneumonia Vaccine (1 of 1 - PCV) Never done   Eye exam for diabetics  07/05/2022   Colon Cancer Screening  11/06/2022   Flu Shot  06/27/2023   Yearly kidney function blood test for diabetes  07/14/2023   Yearly kidney health urinalysis for diabetes  07/14/2023   Complete foot exam   07/14/2023   Hemoglobin A1C  07/22/2023   Medicare Annual Wellness Visit  03/10/2024   DTaP/Tdap/Td vaccine (3 - Td or Tdap) 11/03/2024   Hepatitis C Screening: USPSTF Recommendation to screen - Ages 32-79 yo.  Completed   Zoster (Shingles) Vaccine  Completed   HPV Vaccine  Aged Out   COVID-19 Vaccine  Discontinued    Advanced directives: none  Conditions/risks identified: none  Next appointment: Follow up in one year for your annual wellness visit. 03/11/24 @ 9:45 televisit  Preventive Care 65 Years and Older, Male  Preventive care refers to lifestyle choices and visits with your health care provider that can promote health and wellness. What does preventive care include? A yearly physical exam. This is also called an annual well check. Dental exams once or twice a year. Routine eye exams. Ask your health care provider how often you should have your eyes checked. Personal lifestyle choices, including: Daily care of your teeth and  gums. Regular physical activity. Eating a healthy diet. Avoiding tobacco and drug use. Limiting alcohol use. Practicing safe sex. Taking low doses of aspirin every day. Taking vitamin and mineral supplements as recommended by your health care provider. What happens during an annual well check? The services and screenings done by your health care provider during your annual well check will depend on your age, overall health, lifestyle risk factors, and family history of disease. Counseling  Your health care provider may ask you questions about your: Alcohol use. Tobacco use. Drug use. Emotional well-being. Home and relationship well-being. Sexual activity. Eating habits. History of falls. Memory and ability to understand (cognition). Work and work Astronomer. Screening  You may have the following tests or measurements: Height, weight, and BMI. Blood pressure. Lipid and cholesterol levels. These may be checked every 5 years, or more frequently if you are over 29 years old. Skin check. Lung cancer screening. You may have this screening every year starting at age 60 if you have a 30-pack-year history of smoking and currently smoke or have quit within the past 15 years. Fecal occult blood test (FOBT) of the stool. You may have this test every year starting at age 35. Flexible sigmoidoscopy or colonoscopy. You may have a sigmoidoscopy every 5 years or a  colonoscopy every 10 years starting at age 22. Prostate cancer screening. Recommendations will vary depending on your family history and other risks. Hepatitis C blood test. Hepatitis B blood test. Sexually transmitted disease (STD) testing. Diabetes screening. This is done by checking your blood sugar (glucose) after you have not eaten for a while (fasting). You may have this done every 1-3 years. Abdominal aortic aneurysm (AAA) screening. You may need this if you are a current or former smoker. Osteoporosis. You may be screened  starting at age 31 if you are at high risk. Talk with your health care provider about your test results, treatment options, and if necessary, the need for more tests. Vaccines  Your health care provider may recommend certain vaccines, such as: Influenza vaccine. This is recommended every year. Tetanus, diphtheria, and acellular pertussis (Tdap, Td) vaccine. You may need a Td booster every 10 years. Zoster vaccine. You may need this after age 61. Pneumococcal 13-valent conjugate (PCV13) vaccine. One dose is recommended after age 47. Pneumococcal polysaccharide (PPSV23) vaccine. One dose is recommended after age 45. Talk to your health care provider about which screenings and vaccines you need and how often you need them. This information is not intended to replace advice given to you by your health care provider. Make sure you discuss any questions you have with your health care provider. Document Released: 12/09/2015 Document Revised: 08/01/2016 Document Reviewed: 09/13/2015 Elsevier Interactive Patient Education  2017 [place] Prevention in the Home Falls can cause injuries. They can happen to people of all ages. There are many things you can do to make your home safe and to help prevent falls. What can I do on the outside of my home? Regularly fix the edges of walkways and driveways and fix any cracks. Remove anything that might make you trip as you walk through a door, such as a raised step or threshold. Trim any bushes or trees on the path to your home. Use bright outdoor lighting. Clear any walking paths of anything that might make someone trip, such as rocks or tools. Regularly check to see if handrails are loose or broken. Make sure that both sides of any steps have handrails. Any raised decks and porches should have guardrails on the edges. Have any leaves, snow, or ice cleared regularly. Use sand or salt on walking paths during winter. Clean up any spills in your garage  right away. This includes oil or grease spills. What can I do in the bathroom? Use night lights. Install grab bars by the toilet and in the tub and shower. Do not use towel bars as grab bars. Use non-skid mats or decals in the tub or shower. If you need to sit down in the shower, use a plastic, non-slip stool. Keep the floor dry. Clean up any water that spills on the floor as soon as it happens. Remove soap buildup in the tub or shower regularly. Attach bath mats securely with double-sided non-slip rug tape. Do not have throw rugs and other things on the floor that can make you trip. What can I do in the bedroom? Use night lights. Make sure that you have a light by your bed that is easy to reach. Do not use any sheets or blankets that are too big for your bed. They should not hang down onto the floor. Have a firm chair that has side arms. You can use this for support while you get dressed. Do not have throw rugs and other things  on the floor that can make you trip. What can I do in the kitchen? Clean up any spills right away. Avoid walking on wet floors. Keep items that you use a lot in easy-to-reach places. If you need to reach something above you, use a strong step stool that has a grab bar. Keep electrical cords out of the way. Do not use floor polish or wax that makes floors slippery. If you must use wax, use non-skid floor wax. Do not have throw rugs and other things on the floor that can make you trip. What can I do with my stairs? Do not leave any items on the stairs. Make sure that there are handrails on both sides of the stairs and use them. Fix handrails that are broken or loose. Make sure that handrails are as long as the stairways. Check any carpeting to make sure that it is firmly attached to the stairs. Fix any carpet that is loose or worn. Avoid having throw rugs at the top or bottom of the stairs. If you do have throw rugs, attach them to the floor with carpet tape. Make  sure that you have a light switch at the top of the stairs and the bottom of the stairs. If you do not have them, ask someone to add them for you. What else can I do to help prevent falls? Wear shoes that: Do not have high heels. Have rubber bottoms. Are comfortable and fit you well. Are closed at the toe. Do not wear sandals. If you use a stepladder: Make sure that it is fully opened. Do not climb a closed stepladder. Make sure that both sides of the stepladder are locked into place. Ask someone to hold it for you, if possible. Clearly mark and make sure that you can see: Any grab bars or handrails. First and last steps. Where the edge of each step is. Use tools that help you move around (mobility aids) if they are needed. These include: Canes. Walkers. Scooters. Crutches. Turn on the lights when you go into a dark area. Replace any light bulbs as soon as they burn out. Set up your furniture so you have a clear path. Avoid moving your furniture around. If any of your floors are uneven, fix them. If there are any pets around you, be aware of where they are. Review your medicines with your doctor. Some medicines can make you feel dizzy. This can increase your chance of falling. Ask your doctor what other things that you can do to help prevent falls. This information is not intended to replace advice given to you by your health care provider. Make sure you discuss any questions you have with your health care provider. Document Released: 09/08/2009 Document Revised: 04/19/2016 Document Reviewed: 12/17/2014 Elsevier Interactive Patient Education  2017 [employer].

## 2023-04-08 ENCOUNTER — Encounter: Payer: Self-pay | Admitting: Gastroenterology

## 2023-05-21 ENCOUNTER — Encounter: Payer: Self-pay | Admitting: Gastroenterology

## 2023-05-21 ENCOUNTER — Ambulatory Visit (AMBULATORY_SURGERY_CENTER): Payer: PPO

## 2023-05-21 VITALS — Ht 73.0 in | Wt 225.0 lb

## 2023-05-21 DIAGNOSIS — Z86010 Personal history of colonic polyps: Secondary | ICD-10-CM

## 2023-05-21 MED ORDER — NA SULFATE-K SULFATE-MG SULF 17.5-3.13-1.6 GM/177ML PO SOLN: 1.0000 | Freq: Once | ORAL | 0 refills | Status: AC

## 2023-05-21 NOTE — Progress Notes (Signed)

## 2023-06-15 ENCOUNTER — Other Ambulatory Visit: Payer: Self-pay | Admitting: Family Medicine

## 2023-06-20 ENCOUNTER — Encounter: Payer: Self-pay | Admitting: Certified Registered Nurse Anesthetist

## 2023-06-21 ENCOUNTER — Encounter: Payer: Self-pay | Admitting: Gastroenterology

## 2023-06-21 ENCOUNTER — Ambulatory Visit (AMBULATORY_SURGERY_CENTER): Payer: PPO | Admitting: Gastroenterology

## 2023-06-21 VITALS — BP 127/73 | HR 50 | Temp 96.2°F | Resp 19 | Ht 73.0 in | Wt 225.0 lb

## 2023-06-21 DIAGNOSIS — Z86010 Personal history of colonic polyps: Secondary | ICD-10-CM

## 2023-06-21 DIAGNOSIS — D128 Benign neoplasm of rectum: Secondary | ICD-10-CM | POA: Diagnosis not present

## 2023-06-21 DIAGNOSIS — D122 Benign neoplasm of ascending colon: Secondary | ICD-10-CM

## 2023-06-21 DIAGNOSIS — Z09 Encounter for follow-up examination after completed treatment for conditions other than malignant neoplasm: Secondary | ICD-10-CM

## 2023-06-21 DIAGNOSIS — E119 Type 2 diabetes mellitus without complications: Secondary | ICD-10-CM | POA: Diagnosis not present

## 2023-06-21 MED ORDER — SODIUM CHLORIDE 0.9 % IV SOLN: 500.0000 mL | Freq: Once | INTRAVENOUS | Status: DC

## 2023-06-21 NOTE — Patient Instructions (Signed)
Handouts provided about hemorrhoids and polyps.  Resume previous diet.  Continue present medications.  Await pathology results.  Repeat colonoscopy after studies are complete for surveillance based on pathology results.     YOU HAD AN ENDOSCOPIC PROCEDURE TODAY AT [facility]:   Refer to the procedure report that was given to you for any specific questions about what was found during the examination.  If the procedure report does not answer your questions, please call your gastroenterologist to clarify.  If you requested that your care partner not be given the details of your procedure findings, then the procedure report has been included in a sealed envelope for you to review at your convenience later.  YOU SHOULD EXPECT: Some feelings of bloating in the abdomen. Passage of more gas than usual.  Walking can help get rid of the air that was put into your GI tract during the procedure and reduce the bloating. If you had a lower endoscopy (such as a colonoscopy or flexible sigmoidoscopy) you may notice spotting of blood in your stool or on the toilet paper. If you underwent a bowel prep for your procedure, you may not have a normal bowel movement for a few days.  Please Note:  You might notice some irritation and congestion in your nose or some drainage.  This is from the oxygen used during your procedure.  There is no need for concern and it should clear up in a day or so.  SYMPTOMS TO REPORT IMMEDIATELY:  Following lower endoscopy (colonoscopy or flexible sigmoidoscopy):  Excessive amounts of blood in the stool  Significant tenderness or worsening of abdominal pains  Swelling of the abdomen that is new, acute  Fever of 100F or higher   For urgent or emergent issues, a gastroenterologist can be reached at any hour by calling [phone]. Do not use MyChart messaging for urgent concerns.    DIET:  We do recommend a small meal at first, but then you may proceed to your regular  diet.  Drink plenty of fluids but you should avoid alcoholic beverages for 24 hours.  ACTIVITY:  You should plan to take it easy for the rest of today and you should NOT DRIVE or use heavy machinery until tomorrow (because of the sedation medicines used during the test).    FOLLOW UP: Our staff will call the number listed on your records the next business day following your procedure.  We will call around 7:15- 8:00 am to check on you and address any questions or concerns that you may have regarding the information given to you following your procedure. If we do not reach you, we will leave a message.     If any biopsies were taken you will be contacted by phone or by letter within the next 1-3 weeks.  Please call us at [phone] if you have not heard about the biopsies in 3 weeks.    SIGNATURES/CONFIDENTIALITY: You and/or your care partner have signed paperwork which will be entered into your electronic medical record.  These signatures attest to the fact that that the information above on your After Visit Summary has been reviewed and is understood.  Full responsibility of the confidentiality of this discharge information lies with you and/or your care-partner.

## 2023-06-21 NOTE — Progress Notes (Signed)
History & Physical  Primary Care Physician:  Joaquim Nam, MD Primary Gastroenterologist: Claudette Head, MD  Impression / Plan:  Personal history of adenomatous colon for surveillance colonoscopy  CHIEF COMPLAINT:  Personal history of colon polyps   HPI: Philip Callahan is a 67 y.o. male with a personal history of adenomatous colon polyps for surveillance colonoscopy.    Past Medical History:  Diagnosis Date   Allergic rhinitis 03/2002   Arthritis    BPH (benign prostatic hyperplasia) 03/2002   Cataract    Diabetes mellitus without complication (HCC)    prediabetic per patient - no meds, diet controlled   Heart murmur    as child, no problems   Hyperlipemia 1990's   diet controlled   Kidney stones    passed stones    Past Surgical History:  Procedure Laterality Date   AKN  of scalp  01/2001   Dermatology: Dr. Margo Aye   COLONOSCOPY  07/2007    Prior to Admission medications   Medication Sig Start Date End Date Taking? Authorizing Provider  Ascorbic Acid (VITAMIN C) 500 MG CAPS Take by mouth.   Yes [provider]  atorvastatin (LIPITOR) 10 MG tablet Take 1 tablet (10 mg total) by mouth daily. 07/13/22  Yes Joaquim Nam, MD  Blood Glucose Monitoring Suppl (TRUE METRIX METER) w/Device KIT Use to test blood sugar once daily.  Dx:  E11.9  Non insulin dependent.   Yes [provider]  Cholecalciferol 2000 units TABS Take by mouth daily as needed.   Yes [provider]  folic acid (FOLVITE) 800 MCG tablet Take 400 mcg by mouth daily.   Yes [provider]  glucose blood (TRUE METRIX BLOOD GLUCOSE TEST) test strip Use as instructed to test blood sugar once daily.  Dx:  E11.9  Non insulin dependent   Yes [provider]  Lancets MISC True Metrix Lancets.  Use to test blood sugar once daily.  Dx:  E11.9  Non insulin dependent.   Yes [provider]  metFORMIN (GLUCOPHAGE) 500 MG tablet TAKE 1 TABLET BY MOUTH EVERY DAY  WITH BREAKFAST 06/17/23  Yes Joaquim Nam, MD  Multiple Vitamin (MULTIVITAMIN) tablet Take 1 tablet by mouth as needed.   Yes [provider]  zinc gluconate 50 MG tablet Take 50 mg by mouth daily.   Yes [provider]  diphenhydrAMINE (BENADRYL ALLERGY) 25 MG tablet Take 1 tablet (25 mg total) by mouth at bedtime as needed for sleep. Patient not taking: Reported on 05/21/2023 01/21/23   Joaquim Nam, MD  traZODone (DESYREL) 50 MG tablet Take 0.5-1 tablets (25-50 mg total) by mouth at bedtime. Patient not taking: Reported on 05/21/2023 01/21/23   Joaquim Nam, MD    Current Outpatient Medications  Medication Sig Dispense Refill   Ascorbic Acid (VITAMIN C) 500 MG CAPS Take by mouth.     atorvastatin (LIPITOR) 10 MG tablet Take 1 tablet (10 mg total) by mouth daily. 90 tablet 3   Blood Glucose Monitoring Suppl (TRUE METRIX METER) w/Device KIT Use to test blood sugar once daily.  Dx:  E11.9  Non insulin dependent.     Cholecalciferol 2000 units TABS Take by mouth daily as needed.     folic acid (FOLVITE) 800 MCG tablet Take 400 mcg by mouth daily.     glucose blood (TRUE METRIX BLOOD GLUCOSE TEST) test strip Use as instructed to test blood sugar once daily.  Dx:  E11.9  Non  insulin dependent     Lancets MISC True Metrix Lancets.  Use to test blood sugar once daily.  Dx:  E11.9  Non insulin dependent.     metFORMIN (GLUCOPHAGE) 500 MG tablet TAKE 1 TABLET BY MOUTH EVERY DAY WITH BREAKFAST 90 tablet 3   Multiple Vitamin (MULTIVITAMIN) tablet Take 1 tablet by mouth as needed.     zinc gluconate 50 MG tablet Take 50 mg by mouth daily.     diphenhydrAMINE (BENADRYL ALLERGY) 25 MG tablet Take 1 tablet (25 mg total) by mouth at bedtime as needed for sleep. (Patient not taking: Reported on 05/21/2023)     traZODone (DESYREL) 50 MG tablet Take 0.5-1 tablets (25-50 mg total) by mouth at bedtime. (Patient not taking: Reported on 05/21/2023) 30 tablet 1   Current  Facility-Administered Medications  Medication Dose Route Frequency Provider Last Rate Last Admin   0.9 %  sodium chloride infusion  500 mL Intravenous Once Meryl Dare, MD        Allergies as of 06/21/2023   (No Known Allergies)    Family History  Problem Relation Age of Onset   Heart disease Mother        MI   Hypertension Mother    Diabetes Mother    Hyperlipidemia Mother    Stroke Father    Hypertension Father    Diabetes Father    Anemia Father    Prostate cancer Father    Diabetes Sister    Vitiligo Sister    Diabetes Brother    Cancer Brother        Skin   Alcohol abuse Brother    Heart disease Brother        Pacemaker   Prostate cancer Brother    Depression Neg Hx    Colon cancer Neg Hx    Colon polyps Neg Hx    Stomach cancer Neg Hx    Rectal cancer Neg Hx    Esophageal cancer Neg Hx     Social History   Socioeconomic History   Marital status: Widowed    Spouse name: Not on file   Number of children: 2   Years of education: Not on file   Highest education level: Not on file  Occupational History   Occupation: Research scientist (physical sciences): [employer]  Tobacco Use   Smoking status: Never   Smokeless tobacco: Never  Vaping Use   Vaping status: Never Used  Substance and Sexual Activity   Alcohol use: No    Alcohol/week: 0.0 standard drinks of alcohol   Drug use: No   Sexual activity: Yes  Other Topics Concern   Not on file  Social History Narrative   Widowed 2024, was married 1976   1 daughter local as of 2018 and 1 son moved back 2022 from [place] (both have done mission work)   Retired from [employer], Merchandiser, retail.   [place] [employer] fan   Social Determinants of Health   Financial Resource Strain: Low Risk  (03/11/2023)   Overall Financial Resource Strain (CARDIA)    Difficulty of Paying Living Expenses: Not hard at all  Food Insecurity: No Food Insecurity (03/11/2023)   Hunger Vital Sign    Worried About Running Out of Food  in the Last Year: Never true    Ran Out of Food in the Last Year: Never true  Transportation Needs: No Transportation Needs (03/11/2023)   PRAPARE - Administrator, Civil Service (Medical): No  Lack of Transportation (Non-Medical): No  Physical Activity: Sufficiently Active (03/11/2023)   Exercise Vital Sign    Days of Exercise per Week: 5 days    Minutes of Exercise per Session: 60 min  Stress: No Stress Concern Present (03/11/2023)   [employer] of Occupational Health - Occupational Stress Questionnaire    Feeling of Stress : Not at all  Social Connections: Moderately Integrated (03/11/2023)   Social Connection and Isolation Panel [NHANES]    Frequency of Communication with Friends and Family: Three times a week    Frequency of Social Gatherings with Friends and Family: Three times a week    Attends Religious Services: More than 4 times per year    Active Member of Clubs or Organizations: Yes    Attends Banker Meetings: More than 4 times per year    Marital Status: Widowed  Intimate Partner Violence: Not At Risk (03/11/2023)   Humiliation, Afraid, Rape, and Kick questionnaire    Fear of Current or Ex-Partner: No    Emotionally Abused: No    Physically Abused: No    Sexually Abused: No    Review of Systems:  All systems reviewed were negative except where noted in HPI.   Physical Exam:  General:  Alert, well-developed, in NAD Head:  Normocephalic and atraumatic. Eyes:  Sclera clear, no icterus.   Conjunctiva pink. Ears:  Normal auditory acuity. Mouth:  No deformity or lesions.  Neck:  Supple; no masses. Lungs:  Clear throughout to auscultation.   No wheezes, crackles, or rhonchi.  Heart:  Regular rate and rhythm; no murmurs. Abdomen:  Soft, nondistended, nontender. No masses, hepatomegaly. No palpable masses.  Normal bowel sounds.    Rectal:  Deferred   Msk:  Symmetrical without gross deformities. Extremities:  Without edema. Neurologic:   Alert and  oriented x 4; grossly normal neurologically. Skin:  Intact without significant lesions or rashes. Psych:  Alert and cooperative. Normal mood and affect.   Venita Lick. Russella Dar  06/21/2023, 8:06 AM See Loretha Stapler, [facility], to contact our on call provider

## 2023-06-21 NOTE — Progress Notes (Signed)
VS completed by NS.   Pt's states no medical or surgical changes since previsit or office visit.  

## 2023-06-21 NOTE — Op Note (Signed)
[facility] Patient Name: Philip Callahan Procedure Date: 06/21/2023 7:55 AM MRN: [id number] Endoscopist: Meryl Dare , MD, [phone] Age: 67 Referring MD:  Date of Birth: [birth year] Gender: Male Account #: [id number] Procedure:                Colonoscopy Indications:              Surveillance: Personal history of adenomatous                            polyps on last colonoscopy > 5 years ago Medicines:                Monitored Anesthesia Care Procedure:                Pre-Anesthesia Assessment:                           - Prior to the procedure, a History and Physical                            was performed, and patient medications and                            allergies were reviewed. The patient's tolerance of                            previous anesthesia was also reviewed. The risks                            and benefits of the procedure and the sedation                            options and risks were discussed with the patient.                            All questions were answered, and informed consent                            was obtained. Prior Anticoagulants: The patient has                            taken no anticoagulant or antiplatelet agents. ASA                            Grade Assessment: II - A patient with mild systemic                            disease. After reviewing the risks and benefits,                            the patient was deemed in satisfactory condition to                            undergo the procedure.  After obtaining informed consent, the colonoscope                            was passed under direct vision. Throughout the                            procedure, the patient's blood pressure, pulse, and                            oxygen saturations were monitored continuously. The                            Olympus Scope [id number] was introduced through the                            anus and advanced  to the the cecum, identified by                            appendiceal orifice and ileocecal valve. The                            ileocecal valve, appendiceal orifice, and rectum                            were photographed. The quality of the bowel                            preparation was good. The colonoscopy was performed                            without difficulty. The patient tolerated the                            procedure well. Scope In: 8:12:33 AM Scope Out: 8:26:10 AM Scope Withdrawal Time: 0 hours 11 minutes 42 seconds  Total Procedure Duration: 0 hours 13 minutes 37 seconds  Findings:                 The perianal and digital rectal examinations were                            normal.                           Two sessile polyps were found in the rectum and                            ascending colon. The polyps were 5 to 9 mm in size.                            These polyps were removed with a cold snare.                            Resection and retrieval were complete.  Internal hemorrhoids were found during                            retroflexion. The hemorrhoids were moderate and                            Grade I (internal hemorrhoids that do not prolapse).                           The exam was otherwise without abnormality on                            direct and retroflexion views. Complications:            No immediate complications. Estimated blood loss:                            None. Estimated Blood Loss:     Estimated blood loss: none. Impression:               - Two 5 to 9 mm polyps in the rectum and in the                            ascending colon, removed with a cold snare.                            Resected and retrieved.                           - Internal hemorrhoids. Recommendation:           - Repeat colonoscopy after studies are complete for                            surveillance based on pathology results.                            - Patient has a contact number available for                            emergencies. The signs and symptoms of potential                            delayed complications were discussed with the                            patient. Return to normal activities tomorrow.                            Written discharge instructions were provided to the                            patient.                           - Resume previous diet.                           -  Continue present medications.                           - Await pathology results. Meryl Dare, MD 06/21/2023 8:31:19 AM This report has been signed electronically.

## 2023-06-21 NOTE — Progress Notes (Signed)
Called to room to assist during endoscopic procedure.  Patient ID and intended procedure confirmed with present staff. Received instructions for my participation in the procedure from the performing physician.  

## 2023-06-24 ENCOUNTER — Telehealth: Payer: Self-pay

## 2023-06-24 NOTE — Telephone Encounter (Signed)
Follow up call to pt, lm for pt to call if having any difficulty with normal activities or eating and drinking.  Also to call if any other questions or concerns.  

## 2023-07-05 ENCOUNTER — Encounter: Payer: Self-pay | Admitting: Gastroenterology

## 2023-07-22 ENCOUNTER — Other Ambulatory Visit: Payer: Self-pay | Admitting: Family Medicine

## 2023-07-22 DIAGNOSIS — E119 Type 2 diabetes mellitus without complications: Secondary | ICD-10-CM

## 2023-07-25 ENCOUNTER — Encounter: Payer: Self-pay | Admitting: Family Medicine

## 2023-07-25 ENCOUNTER — Ambulatory Visit (INDEPENDENT_AMBULATORY_CARE_PROVIDER_SITE_OTHER): Payer: PPO | Admitting: Family Medicine

## 2023-07-25 VITALS — BP 132/70 | HR 64 | Temp 98.1°F | Ht 73.0 in | Wt 216.0 lb

## 2023-07-25 DIAGNOSIS — Z23 Encounter for immunization: Secondary | ICD-10-CM

## 2023-07-25 DIAGNOSIS — E119 Type 2 diabetes mellitus without complications: Secondary | ICD-10-CM | POA: Diagnosis not present

## 2023-07-25 DIAGNOSIS — Z7189 Other specified counseling: Secondary | ICD-10-CM

## 2023-07-25 DIAGNOSIS — Z7984 Long term (current) use of oral hypoglycemic drugs: Secondary | ICD-10-CM

## 2023-07-25 DIAGNOSIS — E785 Hyperlipidemia, unspecified: Secondary | ICD-10-CM

## 2023-07-25 DIAGNOSIS — Z0000 Encounter for general adult medical examination without abnormal findings: Secondary | ICD-10-CM

## 2023-07-25 DIAGNOSIS — Z125 Encounter for screening for malignant neoplasm of prostate: Secondary | ICD-10-CM | POA: Diagnosis not present

## 2023-07-25 LAB — COMPREHENSIVE METABOLIC PANEL
ALT: 18 U/L (ref 0–53)
AST: 19 U/L (ref 0–37)
Albumin: 4 g/dL (ref 3.5–5.2)
Alkaline Phosphatase: 71 U/L (ref 39–117)
BUN: 11 mg/dL (ref 6–23)
CO2: 28 meq/L (ref 19–32)
Calcium: 9.2 mg/dL (ref 8.4–10.5)
Chloride: 107 meq/L (ref 96–112)
Creatinine, Ser: 1.04 mg/dL (ref 0.40–1.50)
GFR: 73.31 mL/min (ref >60.00)
Glucose, Bld: 131 mg/dL — ABNORMAL HIGH (ref 70–99)
Potassium: 4.2 meq/L (ref 3.5–5.1)
Sodium: 141 meq/L (ref 135–145)
Total Bilirubin: 0.5 mg/dL (ref 0.2–1.2)
Total Protein: 6.7 g/dL (ref 6.0–8.3)

## 2023-07-25 LAB — CBC WITH DIFFERENTIAL/PLATELET
Basophils Absolute: 0.1 10*3/uL (ref 0.0–0.1)
Basophils Relative: 0.7 % (ref 0.0–3.0)
Eosinophils Absolute: 0.1 10*3/uL (ref 0.0–0.7)
Eosinophils Relative: 1.6 % (ref 0.0–5.0)
HCT: 41.8 % (ref 39.0–52.0)
Hemoglobin: 13.2 g/dL (ref 13.0–17.0)
Lymphocytes Relative: 31.7 % (ref 12.0–46.0)
Lymphs Abs: 2.4 10*3/uL (ref 0.7–4.0)
MCHC: 31.7 g/dL (ref 30.0–36.0)
MCV: 86.6 fl (ref 78.0–100.0)
Monocytes Absolute: 0.5 10*3/uL (ref 0.1–1.0)
Monocytes Relative: 6 % (ref 3.0–12.0)
Neutro Abs: 4.6 10*3/uL (ref 1.4–7.7)
Neutrophils Relative %: 60 % (ref 43.0–77.0)
Platelets: 209 10*3/uL (ref 150.0–400.0)
RBC: 4.83 Mil/uL (ref 4.22–5.81)
RDW: 14 % (ref 11.5–15.5)
WBC: 7.7 10*3/uL (ref 4.0–10.5)

## 2023-07-25 LAB — LIPID PANEL
Cholesterol: 147 mg/dL (ref 0–200)
HDL: 39.7 mg/dL (ref >39.00)
LDL Cholesterol: 76 mg/dL (ref 0–99)
NonHDL: 107.76
Total CHOL/HDL Ratio: 4
Triglycerides: 157 mg/dL — ABNORMAL HIGH (ref 0.0–149.0)
VLDL: 31.4 mg/dL (ref 0.0–40.0)

## 2023-07-25 LAB — MICROALBUMIN / CREATININE URINE RATIO
Creatinine,U: 239.9 mg/dL
Microalb Creat Ratio: 0.3 mg/g (ref 0.0–30.0)
Microalb, Ur: 0.7 mg/dL (ref 0.0–1.9)

## 2023-07-25 LAB — HEMOGLOBIN A1C: Hgb A1c MFr Bld: 7.1 % — ABNORMAL HIGH (ref 4.6–6.5)

## 2023-07-25 NOTE — Patient Instructions (Addendum)
Flu shot today.  Pneumonia 20 at the pharmacy or here later.    Go to the lab on the way out.   If you have mychart we'll likely use that to update you.    Take care.  Glad to see you.  Call about an eye exam when possible.  Recheck in about 6 months.  A1c at the visit.

## 2023-07-25 NOTE — Progress Notes (Signed)
He is trying to get used to living as a widower.  D/w pt.  Support offered.  He'll update me as needed.    Diabetes:  Using medications without difficulties:yes Hypoglycemic episodes:no Hyperglycemic episodes:no Feet problems:no Blood Sugars averaging: usually ~100-160 eye exam within last year: d/w pt about f/u when possible.    Elevated Cholesterol: Using medications without problems: yes Muscle aches: no Diet compliance: yes Exercise: yes Labs pending.     tdap 2015 covid 2021 Flu shot 2024 Shingles d/w pt.   PNA d/w pt.  PSA pending 2024 Colonoscopy 2024 Diet and exercise discussed with patient Living will d/w pt. Would have daughter and son designated if patient were incapacitated but daughter is primary contact since she is local.   FH prostate cancer.  Prostate cancer screening and PSA options (with potential risks and benefits of testing vs not testing) were discussed along with recent recs/guidelines.  He opted testing PSA at this point.  We talked about his multivitamin had enough vit D/vit C etc to cover without taking extra supplements.   Med list updated.   PMH and SH reviewed  Meds, vitals, and allergies reviewed.   ROS: Per HPI unless specifically indicated in ROS section   GEN: nad, alert and oriented HEENT: mucous membranes moist NECK: supple w/o LA CV: rrr. PULM: ctab, no inc wob ABD: soft, +bs EXT: no edema SKIN: no acute rash  Diabetic foot exam: Normal inspection No skin breakdown No calluses  Normal DP pulses Normal sensation to light touch and monofilament Nails normal  30 minutes were devoted to patient care in this encounter (this includes time spent reviewing the patient's file/history, interviewing and examining the patient, counseling/reviewing plan with patient).

## 2023-07-26 LAB — PSA, MEDICARE: PSA: 0.39 ng/mL (ref 0.10–4.00)

## 2023-07-29 NOTE — Assessment & Plan Note (Signed)
tdap 2015 covid 2021 Flu shot 2024 Shingles d/w pt.   PNA d/w pt.  PSA pending 2024 Colonoscopy 2024 Diet and exercise discussed with patient Living will d/w pt. Would have daughter and son designated if patient were incapacitated but daughter is primary contact since she is local.   FH prostate cancer.  Prostate cancer screening and PSA options (with potential risks and benefits of testing vs not testing) were discussed along with recent recs/guidelines.  He opted testing PSA at this point.

## 2023-07-29 NOTE — Assessment & Plan Note (Signed)
Continue atorvastatin.  Continue work on diet and exercise.  See notes on labs.

## 2023-07-29 NOTE — Assessment & Plan Note (Addendum)
Continue work on diet and exercise.  Continue metformin.  See notes on labs.  He can call about an eye exam when possible.  Recheck in about 6 months.  A1c at the visit.

## 2023-07-29 NOTE — Assessment & Plan Note (Signed)
Living will d/w pt. Would have daughter and son designated if patient were incapacitated but daughter is primary contact since she is local.

## 2023-09-24 LAB — HM DIABETES EYE EXAM

## 2023-10-07 DIAGNOSIS — H25812 Combined forms of age-related cataract, left eye: Secondary | ICD-10-CM | POA: Diagnosis not present

## 2023-10-07 DIAGNOSIS — H25811 Combined forms of age-related cataract, right eye: Secondary | ICD-10-CM | POA: Diagnosis not present

## 2023-10-17 ENCOUNTER — Telehealth: Payer: Self-pay

## 2023-10-17 NOTE — Patient Outreach (Signed)
Attempted to contact patient regarding care gaps. Left voicemail for patient to return my call at [phone].  Nicholes Rough, CMA Care Guide [employer]

## 2023-11-01 DIAGNOSIS — E1136 Type 2 diabetes mellitus with diabetic cataract: Secondary | ICD-10-CM | POA: Diagnosis not present

## 2023-11-01 DIAGNOSIS — H25811 Combined forms of age-related cataract, right eye: Secondary | ICD-10-CM | POA: Diagnosis not present

## 2023-11-01 DIAGNOSIS — F419 Anxiety disorder, unspecified: Secondary | ICD-10-CM | POA: Diagnosis not present

## 2023-11-15 DIAGNOSIS — H25812 Combined forms of age-related cataract, left eye: Secondary | ICD-10-CM | POA: Diagnosis not present

## 2023-11-15 DIAGNOSIS — H2512 Age-related nuclear cataract, left eye: Secondary | ICD-10-CM | POA: Diagnosis not present

## 2023-11-15 DIAGNOSIS — E1136 Type 2 diabetes mellitus with diabetic cataract: Secondary | ICD-10-CM | POA: Diagnosis not present

## 2023-11-15 DIAGNOSIS — E785 Hyperlipidemia, unspecified: Secondary | ICD-10-CM | POA: Diagnosis not present

## 2023-11-29 ENCOUNTER — Encounter: Payer: Self-pay | Admitting: Family Medicine

## 2024-01-17 ENCOUNTER — Encounter: Payer: Self-pay | Admitting: Family Medicine

## 2024-01-17 ENCOUNTER — Ambulatory Visit: Payer: PPO | Admitting: Family Medicine

## 2024-01-17 VITALS — BP 142/70 | HR 59 | Temp 97.6°F | Ht 73.0 in | Wt 209.2 lb

## 2024-01-17 DIAGNOSIS — N529 Male erectile dysfunction, unspecified: Secondary | ICD-10-CM | POA: Diagnosis not present

## 2024-01-17 DIAGNOSIS — E119 Type 2 diabetes mellitus without complications: Secondary | ICD-10-CM

## 2024-01-17 LAB — POCT GLYCOSYLATED HEMOGLOBIN (HGB A1C): Hemoglobin A1C: 6.8 % — AB (ref 4.0–5.6)

## 2024-01-17 MED ORDER — SILDENAFIL CITRATE 20 MG PO TABS: 20.0000 mg | ORAL_TABLET | Freq: Every day | ORAL | 12 refills | Status: AC | PRN

## 2024-01-17 NOTE — Patient Instructions (Signed)
Recheck in about 6 months at a yearly visit.  Labs ahead of time.  Update me as needed.  Take care.  Glad to see you.

## 2024-01-17 NOTE — Progress Notes (Signed)
 Diabetes:  Using medications without difficulties: yes Hypoglycemic episodes:rare, only if prolonged fasting.  Hyperglycemic episodes:no sx Feet problems:no Blood Sugars averaging: not checked often.   eye exam within last year: yes A1c 6.8. d/w pt at OV. Improved.  He is still swimming and working on diet.    He had cataract surgery.  Only using reading glasses now.    ED d/w pt.  In relationship, d/w pt.  No dysuria.  No CP.  Not SOB.  No NTG use.  No BP meds. Routine med options and cautions d/w pt, esp re: sildenafil.   PMH and SH reviewed  Meds, vitals, and allergies reviewed.   ROS: Per HPI unless specifically indicated in ROS section   GEN: nad, alert and oriented HEENT: ncat NECK: supple w/o LA CV: rrr. PULM: ctab, no inc wob ABD: soft, +bs EXT: no edema SKIN: well perfused.   30 minutes were devoted to patient care in this encounter (this includes time spent reviewing the patient's file/history, interviewing and examining the patient, counseling/reviewing plan with patient).

## 2024-01-18 DIAGNOSIS — N529 Male erectile dysfunction, unspecified: Secondary | ICD-10-CM | POA: Insufficient documentation

## 2024-01-18 NOTE — Assessment & Plan Note (Signed)
 Continue metformin and diet/exercise.  Recheck in about 6 months at a yearly visit.  Labs ahead of time.

## 2024-01-18 NOTE — Assessment & Plan Note (Signed)
 Start sildenafil. Routine med options and cautions d/w pt. No NTG use.  Update me as needed.

## 2024-01-21 ENCOUNTER — Telehealth: Payer: Self-pay

## 2024-01-21 ENCOUNTER — Other Ambulatory Visit (HOSPITAL_COMMUNITY): Payer: Self-pay

## 2024-01-21 NOTE — Telephone Encounter (Signed)
 Pharmacy Patient Advocate Encounter   Received notification from Patient Pharmacy that prior authorization for Sildenafil 20mg  is required/requested.   Insurance verification completed.   The patient is insured through [facility] ADVANT[employer]/RX ADVANCE .   Per test claim: Sildenafil 20mg  is a Part D exclusion for treating ED. Patient may try to use a a discount card like GoodRx to help save some him some money.

## 2024-01-21 NOTE — Telephone Encounter (Signed)
 Called patient and reviewed all information. Patient verbalized understanding. Will call if any further questions.

## 2024-01-24 ENCOUNTER — Ambulatory Visit: Payer: PPO | Admitting: Family Medicine

## 2024-03-11 ENCOUNTER — Ambulatory Visit: Payer: PPO

## 2024-03-11 VITALS — Ht 73.0 in | Wt 209.0 lb

## 2024-03-11 DIAGNOSIS — Z0000 Encounter for general adult medical examination without abnormal findings: Secondary | ICD-10-CM | POA: Diagnosis not present

## 2024-03-11 NOTE — Progress Notes (Signed)
 Please attest and cosign this visit due to patients primary care provider not being in the office at the time the visit was completed.    Subjective:   Philip Callahan is a 68 y.o. who presents for a Medicare Wellness preventive visit.  Visit Complete: Virtual I connected with  [name] on 03/11/24 by a audio enabled telemedicine application and verified that I am speaking with the correct person using two identifiers.  Patient Location: Home  Provider Location: Office/Clinic  I discussed the limitations of evaluation and management by telemedicine. The patient expressed understanding and agreed to proceed.  Vital Signs: Because this visit was a virtual/telehealth visit, some criteria may be missing or patient reported. Any vitals not documented were not able to be obtained and vitals that have been documented are patient reported.  VideoDeclined- This patient declined Librarian, academic. Therefore the visit was completed with audio only.  Persons Participating in Visit: Patient.  AWV Questionnaire: No: Patient Medicare AWV questionnaire was not completed prior to this visit.  Cardiac Risk Factors include: advanced age (>40men, >19 women);diabetes mellitus;dyslipidemia;male gender     Objective:    Today's Vitals   03/11/24 1012  Weight: 209 lb (94.8 kg)  Height: 6\' 1"  (1.854 m)   Body mass index is 27.57 kg/m.     03/11/2024   10:23 AM 03/11/2023    9:59 AM 03/06/2022    9:12 AM 07/04/2021    8:55 AM 03/01/2021    9:00 AM 02/16/2020    9:03 AM 08/01/2019    8:41 AM  Advanced Directives  Does Patient Have a Medical Advance Directive? No No No No No No No  Would patient like information on creating a medical advance directive?  No - Patient declined No - Patient declined No - Patient declined No - Patient declined No - Patient declined No - Patient declined    Current Medications (verified) Outpatient Encounter Medications as of 03/11/2024   Medication Sig   atorvastatin (LIPITOR) 10 MG tablet TAKE 1 TABLET BY MOUTH EVERY DAY   Blood Glucose Monitoring Suppl (TRUE METRIX METER) w/Device KIT Use to test blood sugar once daily.  Dx:  E11.9  Non insulin dependent.   glucose blood (TRUE METRIX BLOOD GLUCOSE TEST) test strip Use as instructed to test blood sugar once daily.  Dx:  E11.9  Non insulin dependent   Lancets MISC True Metrix Lancets.  Use to test blood sugar once daily.  Dx:  E11.9  Non insulin dependent.   metFORMIN (GLUCOPHAGE) 500 MG tablet TAKE 1 TABLET BY MOUTH EVERY DAY WITH BREAKFAST   Multiple Vitamin (MULTIVITAMIN) tablet Take 1 tablet by mouth as needed.   sildenafil (REVATIO) 20 MG tablet Take 1-5 tablets (20-100 mg total) by mouth daily as needed.   No facility-administered encounter medications on file as of 03/11/2024.    Allergies (verified) Patient has no known allergies.   History: Past Medical History:  Diagnosis Date   Allergic rhinitis 03/2002   Arthritis    BPH (benign prostatic hyperplasia) 03/2002   Cataract    Diabetes mellitus without complication (HCC)    prediabetic per patient - no meds, diet controlled   Heart murmur    as child, no problems   Hyperlipemia 1990's   diet controlled   Kidney stones    passed stones   Past Surgical History:  Procedure Laterality Date   AKN  of scalp  01/2001   Dermatology: Dr. Margo Aye  COLONOSCOPY  07/2007   Family History  Problem Relation Age of Onset   Heart disease Mother        MI   Hypertension Mother    Diabetes Mother    Hyperlipidemia Mother    Stroke Father    Hypertension Father    Diabetes Father    Anemia Father    Prostate cancer Father    Diabetes Sister    Vitiligo Sister    Diabetes Brother    Cancer Brother        Skin   Alcohol abuse Brother    Heart disease Brother        Pacemaker   Prostate cancer Brother    Depression Neg Hx    Colon cancer Neg Hx    Colon polyps Neg Hx    Stomach cancer Neg Hx    Rectal  cancer Neg Hx    Esophageal cancer Neg Hx    Social History   Socioeconomic History   Marital status: Widowed    Spouse name: Not on file   Number of children: 2   Years of education: Not on file   Highest education level: Not on file  Occupational History   Occupation: Research scientist (physical sciences): [employer]  Tobacco Use   Smoking status: Never   Smokeless tobacco: Never  Vaping Use   Vaping status: Never Used  Substance and Sexual Activity   Alcohol use: No    Alcohol/week: 0.0 standard drinks of alcohol   Drug use: No   Sexual activity: Yes  Other Topics Concern   Not on file  Social History Narrative   Widowed 2024, was married 1976   1 daughter local as of 2018 and 1 son moved back 2022 from [place]- now in [place] (both have done mission work)   Retired from [employer], Merchandiser, retail.   [employer]   Social Drivers of Health   Financial Resource Strain: Low Risk  (03/11/2024)   Overall Financial Resource Strain (CARDIA)    Difficulty of Paying Living Expenses: Not hard at all  Food Insecurity: No Food Insecurity (03/11/2024)   Hunger Vital Sign    Worried About Running Out of Food in the Last Year: Never true    Ran Out of Food in the Last Year: Never true  Transportation Needs: No Transportation Needs (03/11/2024)   PRAPARE - Administrator, Civil Service (Medical): No    Lack of Transportation (Non-Medical): No  Physical Activity: Sufficiently Active (03/11/2024)   Exercise Vital Sign    Days of Exercise per Week: 5 days    Minutes of Exercise per Session: 60 min  Stress: No Stress Concern Present (03/11/2024)   [employer] of Occupational Health - Occupational Stress Questionnaire    Feeling of Stress : Not at all  Social Connections: Moderately Isolated (03/11/2024)   Social Connection and Isolation Panel [NHANES]    Frequency of Communication with Friends and Family: More than three times a week    Frequency of Social  Gatherings with Friends and Family: More than three times a week    Attends Religious Services: More than 4 times per year    Active Member of [employer] or Organizations: No    Attends Banker Meetings: Not on file    Marital Status: Widowed    Tobacco Counseling Counseling given: Not Answered    Clinical Intake:  Pre-visit preparation completed: Yes  Pain : No/denies pain  BMI - recorded: 27.57 Nutritional Status: BMI 25 -29 Overweight Nutritional Risks: None Diabetes: Yes CBG done?: No Did pt. bring in CBG monitor from home?: No  Lab Results  Component Value Date   HGBA1C 6.8 (A) 01/17/2024   HGBA1C 7.1 (H) 07/25/2023   HGBA1C 7.1 (A) 01/21/2023     How often do you need to have someone help you when you read instructions, pamphlets, or other written materials from your doctor or pharmacy?: 1 - Never  Interpreter Needed?: No  Comments: lives with daughter Information entered by :: B.Conley Pawling,LPN   Activities of Daily Living     03/11/2024   10:23 AM  In your present state of health, do you have any difficulty performing the following activities:  Hearing? 0  Vision? 0  Difficulty concentrating or making decisions? 0  Walking or climbing stairs? 0  Dressing or bathing? 0  Doing errands, shopping? 0  Preparing Food and eating ? N  Using the Toilet? N  In the past six months, have you accidently leaked urine? N  Do you have problems with loss of bowel control? N  Managing your Medications? N  Managing your Finances? N  Housekeeping or managing your Housekeeping? N    Patient Care Team: Donnie Galea, MD as PCP - General (Family Medicine) [facility], [facility]  Indicate any recent Medical Services you may have received from other than [facility] providers in the past year (date may be approximate).     Assessment:   This is a routine wellness examination for Silvio.  Hearing/Vision screen Hearing Screening - Comments:: Pt says his  hearing is good Vision Screening - Comments:: Pt says his vision is good after cataract surgery;readers only [facility]    Goals Addressed             This Visit's Progress    Patient Stated       03/11/24, I will continue to swim 5 days a week for 45 minutes.      Patient Stated   On track    03/11/24-Continue current lifestyle     COMPLETED: Patient Stated       Lose 12 pounds.       Depression Screen     03/11/2024   10:18 AM 01/17/2024   10:54 AM 07/25/2023    7:59 AM 07/25/2023    7:55 AM 03/11/2023    9:57 AM 01/21/2023    8:15 AM 03/06/2022    9:18 AM  PHQ 2/9 Scores  PHQ - 2 Score 0 0 0 0 0 2 0  PHQ- 9 Score  0 4  0 8     Fall Risk     03/11/2024   10:16 AM 01/17/2024   10:53 AM 07/25/2023    7:55 AM 03/11/2023    9:51 AM 01/21/2023    8:14 AM  Fall Risk   Falls in the past year? 0 0 0 0 0  Number falls in past yr: 0 0 0 0 0  Injury with Fall? 0 0 0 0 0  Risk for fall due to : No Fall Risks No Fall Risks No Fall Risks No Fall Risks No Fall Risks  Follow up Falls prevention discussed;Education provided Falls evaluation completed Falls evaluation completed Falls prevention discussed;Falls evaluation completed Falls evaluation completed    MEDICARE RISK AT HOME:  Medicare Risk at Home Any stairs in or around the home?: No If so, are there any without handrails?: No Home free of  loose throw rugs in walkways, pet beds, electrical cords, etc?: Yes Adequate lighting in your home to reduce risk of falls?: Yes Life alert?: No Use of a cane, walker or w/c?: No Grab bars in the bathroom?: Yes Shower chair or bench in shower?: No Elevated toilet seat or a handicapped toilet?: Yes  TIMED UP AND GO:  Was the test performed?  No  Cognitive Function: 6CIT completed    03/01/2021    9:06 AM 02/16/2020    9:05 AM  MMSE - Mini Mental State Exam  Orientation to time 5 5  Orientation to Place 5 5  Registration 3 3  Attention/ Calculation 5 5  Recall 3 3  Language-  repeat 1 1        03/11/2024   10:24 AM 03/11/2023   10:00 AM 03/06/2022    9:14 AM  6CIT Screen  What Year? 0 points 0 points 0 points  What month? 0 points 0 points 0 points  What time? 0 points 0 points 0 points  Count back from 20 0 points 0 points 0 points  Months in reverse 0 points 0 points 0 points  Repeat phrase 0 points 0 points 0 points  Total Score 0 points 0 points 0 points    Immunizations Immunization History  Administered Date(s) Administered   Fluad Quad(high Dose 65+) 10/30/2019, 08/11/2020   Fluad Trivalent(High Dose 65+) 07/25/2023   Influenza Split 10/11/2011, 10/14/2012   Influenza Whole 09/23/2008, 09/26/2009, 10/05/2010   Influenza, High Dose Seasonal PF 10/15/2022   Influenza,inj,Quad PF,6+ Mos 11/03/2014, 08/15/2017   PFIZER(Purple Top)SARS-COV-2 Vaccination 01/07/2020, 01/11/2020, 02/01/2020, 10/27/2020   Pfizer(Comirnaty)Fall Seasonal Vaccine 12 years and older 10/27/2023   Td 03/31/2004   Tdap 11/03/2014   Zoster Recombinant(Shingrix) 01/17/2022, 03/21/2022    Screening Tests Health Maintenance  Topic Date Due   Pneumonia Vaccine 75+ Years old (1 of 2 - PCV) Never done   INFLUENZA VACCINE  06/26/2024   HEMOGLOBIN A1C  07/16/2024   Diabetic kidney evaluation - eGFR measurement  07/24/2024   Diabetic kidney evaluation - Urine ACR  07/24/2024   FOOT EXAM  07/24/2024   OPHTHALMOLOGY EXAM  09/23/2024   DTaP/Tdap/Td (3 - Td or Tdap) 11/03/2024   Medicare Annual Wellness (AWV)  03/11/2025   Colonoscopy  06/20/2030   Hepatitis C Screening  Completed   Zoster Vaccines- Shingrix  Completed   HPV VACCINES  Aged Out   Meningococcal B Vaccine  Aged Out   COVID-19 Vaccine  Discontinued    Health Maintenance  Health Maintenance Due  Topic Date Due   Pneumonia Vaccine 28+ Years old (1 of 2 - PCV) Never done   Health Maintenance Items Addressed: None needed  Additional Screening:  Vision Screening: Recommended annual ophthalmology exams for  early detection of glaucoma and other disorders of the eye.  Dental Screening: Recommended annual dental exams for proper oral hygiene  Community Resource Referral / Chronic Care Management: CRR required this visit?  No   CCM required this visit?  No     Plan:     I have personally reviewed and noted the following in the patient's chart:   Medical and social history Use of alcohol, tobacco or illicit drugs  Current medications and supplements including opioid prescriptions. Patient is not currently taking opioid prescriptions. Functional ability and status Nutritional status Physical activity Advanced directives List of other physicians Hospitalizations, surgeries, and ER visits in previous 12 months Vitals Screenings to include cognitive, depression, and falls Referrals and  appointments  In addition, I have reviewed and discussed with patient certain preventive protocols, quality metrics, and best practice recommendations. A written personalized care plan for preventive services as well as general preventive health recommendations were provided to patient.     Nerissa Bannister, LPN   6/64/4034   After Visit Summary: (Declined) Due to this being a telephonic visit, with patients personalized plan was offered to patient but patient Declined AVS at this time   Notes: Nothing significant to report at this time.

## 2024-03-11 NOTE — Patient Instructions (Signed)
 Mr. Salvato , Thank you for taking time to come for your Medicare Wellness Visit. I appreciate your ongoing commitment to your health goals. Please review the following plan we discussed and let me know if I can assist you in the future.   Referrals/Orders/Follow-Ups/Clinician Recommendations: none  This is a list of the screening recommended for you and due dates:  Health Maintenance  Topic Date Due   Pneumonia Vaccine (1 of 2 - PCV) Never done   Flu Shot  06/26/2024   Hemoglobin A1C  07/16/2024   Yearly kidney function blood test for diabetes  07/24/2024   Yearly kidney health urinalysis for diabetes  07/24/2024   Complete foot exam   07/24/2024   Eye exam for diabetics  09/23/2024   DTaP/Tdap/Td vaccine (3 - Td or Tdap) 11/03/2024   Medicare Annual Wellness Visit  03/11/2025   Colon Cancer Screening  06/20/2030   Hepatitis C Screening  Completed   Zoster (Shingles) Vaccine  Completed   HPV Vaccine  Aged Out   Meningitis B Vaccine  Aged Out   COVID-19 Vaccine  Discontinued    Advanced directives: (Declined) Advance directive discussed with you today. Even though you declined this today, please call our office should you change your mind, and we can give you the proper paperwork for you to fill out.  Next Medicare Annual Wellness Visit scheduled for next year: Yes 03/12/25 @ 10:10am televisit

## 2024-06-19 ENCOUNTER — Other Ambulatory Visit: Payer: Self-pay | Admitting: Family Medicine

## 2024-06-19 DIAGNOSIS — E119 Type 2 diabetes mellitus without complications: Secondary | ICD-10-CM

## 2024-07-16 ENCOUNTER — Encounter: Payer: Self-pay | Admitting: Family Medicine

## 2024-07-16 ENCOUNTER — Ambulatory Visit (INDEPENDENT_AMBULATORY_CARE_PROVIDER_SITE_OTHER): Payer: PPO | Admitting: Family Medicine

## 2024-07-16 VITALS — BP 142/70 | HR 62 | Temp 98.4°F | Ht 73.0 in | Wt 210.2 lb

## 2024-07-16 DIAGNOSIS — Z125 Encounter for screening for malignant neoplasm of prostate: Secondary | ICD-10-CM

## 2024-07-16 DIAGNOSIS — Z7984 Long term (current) use of oral hypoglycemic drugs: Secondary | ICD-10-CM | POA: Diagnosis not present

## 2024-07-16 DIAGNOSIS — E785 Hyperlipidemia, unspecified: Secondary | ICD-10-CM | POA: Diagnosis not present

## 2024-07-16 DIAGNOSIS — E119 Type 2 diabetes mellitus without complications: Secondary | ICD-10-CM | POA: Diagnosis not present

## 2024-07-16 DIAGNOSIS — N529 Male erectile dysfunction, unspecified: Secondary | ICD-10-CM

## 2024-07-16 DIAGNOSIS — Z7189 Other specified counseling: Secondary | ICD-10-CM

## 2024-07-16 DIAGNOSIS — Z0000 Encounter for general adult medical examination without abnormal findings: Secondary | ICD-10-CM

## 2024-07-16 LAB — COMPREHENSIVE METABOLIC PANEL WITH GFR
ALT: 21 U/L (ref 0–53)
AST: 25 U/L (ref 0–37)
Albumin: 4.1 g/dL (ref 3.5–5.2)
Alkaline Phosphatase: 77 U/L (ref 39–117)
BUN: 11 mg/dL (ref 6–23)
CO2: 29 meq/L (ref 19–32)
Calcium: 9 mg/dL (ref 8.4–10.5)
Chloride: 105 meq/L (ref 96–112)
Creatinine, Ser: 1.11 mg/dL (ref 0.40–1.50)
GFR: 67.33 mL/min (ref >60.00)
Glucose, Bld: 133 mg/dL — ABNORMAL HIGH (ref 70–99)
Potassium: 4.3 meq/L (ref 3.5–5.1)
Sodium: 141 meq/L (ref 135–145)
Total Bilirubin: 0.7 mg/dL (ref 0.2–1.2)
Total Protein: 7 g/dL (ref 6.0–8.3)

## 2024-07-16 LAB — CBC WITH DIFFERENTIAL/PLATELET
Basophils Absolute: 0 K/uL (ref 0.0–0.1)
Basophils Relative: 0.7 % (ref 0.0–3.0)
Eosinophils Absolute: 0.1 K/uL (ref 0.0–0.7)
Eosinophils Relative: 1.8 % (ref 0.0–5.0)
HCT: 42 % (ref 39.0–52.0)
Hemoglobin: 13.6 g/dL (ref 13.0–17.0)
Lymphocytes Relative: 32.3 % (ref 12.0–46.0)
Lymphs Abs: 2.3 K/uL (ref 0.7–4.0)
MCHC: 32.4 g/dL (ref 30.0–36.0)
MCV: 85.1 fl (ref 78.0–100.0)
Monocytes Absolute: 0.4 K/uL (ref 0.1–1.0)
Monocytes Relative: 6.4 % (ref 3.0–12.0)
Neutro Abs: 4.2 K/uL (ref 1.4–7.7)
Neutrophils Relative %: 58.8 % (ref 43.0–77.0)
Platelets: 190 K/uL (ref 150.0–400.0)
RBC: 4.93 Mil/uL (ref 4.22–5.81)
RDW: 14 % (ref 11.5–15.5)
WBC: 7.1 K/uL (ref 4.0–10.5)

## 2024-07-16 LAB — MICROALBUMIN / CREATININE URINE RATIO
Creatinine,U: 239.8 mg/dL
Microalb Creat Ratio: 5.1 mg/g (ref 0.0–30.0)
Microalb, Ur: 1.2 mg/dL (ref 0.0–1.9)

## 2024-07-16 LAB — LIPID PANEL
Cholesterol: 155 mg/dL (ref 0–200)
HDL: 50.6 mg/dL (ref >39.00)
LDL Cholesterol: 75 mg/dL (ref 0–99)
NonHDL: 104.33
Total CHOL/HDL Ratio: 3
Triglycerides: 146 mg/dL (ref 0.0–149.0)
VLDL: 29.2 mg/dL (ref 0.0–40.0)

## 2024-07-16 LAB — PSA, MEDICARE: PSA: 1.15 ng/mL (ref 0.10–4.00)

## 2024-07-16 LAB — HEMOGLOBIN A1C: Hgb A1c MFr Bld: 6.7 % — ABNORMAL HIGH (ref 4.6–6.5)

## 2024-07-16 NOTE — Patient Instructions (Addendum)
 Go to the lab on the way out.   If you have mychart we'll likely use that to update you.    Take care.  Glad to see you. I would get a flu shot each fall.   PNA-20 vaccine when possible.  Tetanus may be cheaper at the pharmacy.  Recheck in about 6 months.  A1c at the visit.  Update me as needed.    Let me know if your BP is persistently above 140/90.

## 2024-07-16 NOTE — Progress Notes (Signed)
 Diabetes:  Using medications without difficulties:yes Hypoglycemic episodes:rare sx, corrected with a snack if needed.   Hyperglycemic episodes:no Feet problems:no Blood Sugars averaging: not checked often. eye exam within last year: yes Labs pending.    Still swimming for exercise.  Swimming 1.5 hours at a time.    Elevated Cholesterol: Using medications without problems: yes Muscle aches: no Diet compliance: yes Exercise: yes Labs pending.    ED d/w pt.  Improved with sildenafil , used prn.  No ADE on med.     tdap 2015 covid prev done.  Flu shot 2024, d/w pt.   Shingles prev done.  PNA d/w pt.  RSV d/w pt.   PSA pending 2025 Colonoscopy 2024 Diet and exercise discussed with patient Living will d/w pt. Would have daughter and son designated if patient were incapacitated but daughter is primary contact since she is local.   FH prostate cancer.  Prostate cancer screening and PSA options (with potential risks and benefits of testing vs not testing) were discussed along with recent recs/guidelines.  He opted testing PSA at this point.  Recheck BP 142/70 and d/w pt about check BP out of clinic.  He can update me as needed.   PMH and SH reviewed   Meds, vitals, and allergies reviewed.    ROS: Per HPI unless specifically indicated in ROS section    GEN: nad, alert and oriented HEENT: mucous membranes moist NECK: supple w/o LA CV: rrr. PULM: ctab, no inc wob ABD: soft, +bs EXT: no edema SKIN: no acute rash   Diabetic foot exam: Normal inspection No skin breakdown No calluses  Normal DP pulses Normal sensation to light touch and monofilament Nails normal

## 2024-07-19 ENCOUNTER — Ambulatory Visit: Payer: Self-pay | Admitting: Family Medicine

## 2024-07-19 NOTE — Assessment & Plan Note (Signed)
 Continue work on diet and exercise. See notes on labs.  Continue metformin .

## 2024-07-19 NOTE — Assessment & Plan Note (Signed)
 tdap 2015 covid prev done.  Flu shot 2024, d/w pt.   Shingles prev done.  PNA d/w pt.  RSV d/w pt.   PSA pending 2025 Colonoscopy 2024 Diet and exercise discussed with patient Living will d/w pt. Would have daughter and son designated if patient were incapacitated but daughter is primary contact since she is local.   FH prostate cancer.  Prostate cancer screening and PSA options (with potential risks and benefits of testing vs not testing) were discussed along with recent recs/guidelines.  He opted testing PSA at this point.

## 2024-07-19 NOTE — Assessment & Plan Note (Signed)
Living will d/w pt. Would have daughter and son designated if patient were incapacitated but daughter is primary contact since she is local.

## 2024-07-19 NOTE — Assessment & Plan Note (Signed)
Continue work on diet and exercise.  See notes on labs.  Continue atorvastatin.

## 2024-07-19 NOTE — Assessment & Plan Note (Signed)
 ED d/w pt.  Improved with sildenafil , used prn.  No ADE on med.  Continue as is.

## 2024-07-22 NOTE — Telephone Encounter (Signed)
 Copied from CRM [id number]. Topic: Clinical - Lab/Test Results >> Jul 21, 2024  9:12 AM Revonda D wrote: Reason for CRM: Pt was made aware of recent lab results.

## 2024-11-04 ENCOUNTER — Ambulatory Visit: Payer: Self-pay | Admitting: Family Medicine

## 2024-11-04 LAB — OPHTHALMOLOGY REPORT-SCANNED

## 2025-01-18 ENCOUNTER — Ambulatory Visit: Admitting: Family Medicine

## 2025-03-12 ENCOUNTER — Ambulatory Visit
